# Patient Record
Sex: Female | Born: 1950 | Race: White | Hispanic: No | Smoking: Former smoker
Health system: Southern US, Community
[De-identification: ages and names within clinical notes are randomized; demographics above are authoritative.]

## PROBLEM LIST (undated history)

## (undated) DIAGNOSIS — F40240 Claustrophobia: Secondary | ICD-10-CM

## (undated) DIAGNOSIS — E785 Hyperlipidemia, unspecified: Secondary | ICD-10-CM

## (undated) DIAGNOSIS — C801 Malignant (primary) neoplasm, unspecified: Secondary | ICD-10-CM

## (undated) DIAGNOSIS — E119 Type 2 diabetes mellitus without complications: Secondary | ICD-10-CM

## (undated) DIAGNOSIS — Z8719 Personal history of other diseases of the digestive system: Secondary | ICD-10-CM

## (undated) DIAGNOSIS — M1990 Unspecified osteoarthritis, unspecified site: Secondary | ICD-10-CM

## (undated) DIAGNOSIS — K219 Gastro-esophageal reflux disease without esophagitis: Secondary | ICD-10-CM

## (undated) DIAGNOSIS — I10 Essential (primary) hypertension: Secondary | ICD-10-CM

## (undated) DIAGNOSIS — T8859XA Other complications of anesthesia, initial encounter: Secondary | ICD-10-CM

## (undated) HISTORY — PX: REPLACEMENT TOTAL KNEE BILATERAL: SUR1225

## (undated) HISTORY — PX: BACK SURGERY: SHX140

## (undated) HISTORY — PX: ROTATOR CUFF REPAIR: SHX139

## (undated) HISTORY — DX: Essential (primary) hypertension: I10

## (undated) HISTORY — PX: PLANTAR FASCIA SURGERY: SHX746

## (undated) HISTORY — DX: Hyperlipidemia, unspecified: E78.5

## (undated) HISTORY — DX: Type 2 diabetes mellitus without complications: E11.9

## (undated) HISTORY — DX: Gastro-esophageal reflux disease without esophagitis: K21.9

## (undated) HISTORY — PX: OTHER SURGICAL HISTORY: SHX169

---

## 1999-06-13 DIAGNOSIS — G4730 Sleep apnea, unspecified: Secondary | ICD-10-CM

## 1999-06-13 HISTORY — DX: Sleep apnea, unspecified: G47.30

## 2013-06-12 HISTORY — PX: CHOLECYSTECTOMY: SHX55

## 2019-06-10 ENCOUNTER — Encounter (INDEPENDENT_AMBULATORY_CARE_PROVIDER_SITE_OTHER): Payer: Self-pay | Admitting: Gastroenterology

## 2019-07-17 ENCOUNTER — Other Ambulatory Visit: Payer: Self-pay

## 2019-07-17 ENCOUNTER — Ambulatory Visit (INDEPENDENT_AMBULATORY_CARE_PROVIDER_SITE_OTHER): Payer: Medicare Other | Admitting: Gastroenterology

## 2019-07-17 ENCOUNTER — Encounter (INDEPENDENT_AMBULATORY_CARE_PROVIDER_SITE_OTHER): Payer: Self-pay | Admitting: Gastroenterology

## 2019-07-17 VITALS — BP 144/82 | HR 89 | Temp 97.4°F | Ht 66.0 in | Wt 268.8 lb

## 2019-07-17 DIAGNOSIS — R197 Diarrhea, unspecified: Secondary | ICD-10-CM

## 2019-07-17 DIAGNOSIS — R1084 Generalized abdominal pain: Secondary | ICD-10-CM | POA: Diagnosis not present

## 2019-07-17 DIAGNOSIS — K219 Gastro-esophageal reflux disease without esophagitis: Secondary | ICD-10-CM

## 2019-07-17 DIAGNOSIS — Z800 Family history of malignant neoplasm of digestive organs: Secondary | ICD-10-CM | POA: Diagnosis not present

## 2019-07-17 MED ORDER — DICYCLOMINE HCL 10 MG PO CAPS: 10.0000 mg | ORAL_CAPSULE | Freq: Two times a day (BID) | ORAL | 0 refills | Status: DC | PRN

## 2019-07-17 NOTE — Progress Notes (Signed)
Patient profile: Ashlee Leblanc is a 69 y.o. female seen for evaluation of diarrhea . Referred by Ephriam Jenkins FNP  History of Present Illness: Ashlee Leblanc is seen today for evaluation of diarrhea, she reports this has been ongoing for many years.  Reports she had her gallbladder out about 5 years ago for abd pain and diarrhea which did not help change symptoms significantly.   Reports typically having 4-5 bowel movements a day that are soft, she does have some significant urgency postprandially if she eats out and unable to make it home.  She has abdominal cramping with the diarrhea.  She stopped magnesium which helped some but did not improve symptoms.  Reports being on Metformin for at least 20 years and does not feel diarrhe started at that time.  She denies any blood in stools.  She has not found any clear food triggers.  She tried cholestyramine in the past which caused constipation even at 1/2 pack a day.  Chronically GERD is well controlled omeprazole 20 mg daily.  She feels queasy with her blood sugars if abnormal at times but otherwise denies nausea/vomiting.  No dysphagia.  Appetite good.  Drinks several [employer] a day, she does report stopping [facility] in the past and not affecting the diarrhea  Wt Readings from Last 3 Encounters:  07/17/19 268 lb 12.8 oz (121.9 kg)     Last Colonoscopy: Dr. Loni Muse February 2018-colon polyps 2 to 3 mm polyps biopsy from cecum ascending, diverticulosis, hemorrhoids.  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 Diabetes ([place])   . GERD (gastroesophageal reflux disease)   . HLD (hyperlipidemia)    . HTN (hypertension)       Problem List: There are no problems to display for this patient.   Past Surgical History: Past Surgical History:  Procedure Laterality Date  . CHOLECYSTECTOMY  2015  . REPLACEMENT TOTAL KNEE BILATERAL    . ROTATOR CUFF REPAIR     bilateral at different dates    Allergies: Allergies  Allergen Reactions  . Benadryl [Diphenhydramine] Other (See Comments)    Patient calls it the jerks.  . Darvon [Propoxyphene] Other (See Comments)    Patient passed out  . Morphine And Related Hives  . Tetracyclines & Related Other (See Comments)    Bad chest pain and burning      Home Medications:  Current Outpatient Medications:  .  Ascorbic Acid (VITAMIN C WITH ROSE HIPS) 500 MG tablet, Take 500 mg by mouth daily., Disp: , Rfl:  .  Calcium Carbonate-Vitamin D (CALCIUM 600+D PO), Take by mouth daily., Disp: , Rfl:  .  citalopram (CELEXA) 20 MG tablet, Take 20 mg by mouth daily., Disp: , Rfl:  .  enalapril (VASOTEC) 10 MG tablet, Take 10 mg by mouth daily., Disp: , Rfl:  .  glipiZIDE (GLUCOTROL) 5 MG tablet, Take by mouth 2 (two) times daily before a meal., Disp: , Rfl:  .  hydrochlorothiazide (HYDRODIURIL) 12.5 MG tablet, Take 12.5 mg by mouth daily., Disp: , Rfl:  .  MEGARED OMEGA-3 KRILL OIL PO, Take by mouth daily., Disp: ,  Rfl:  .  metFORMIN (GLUCOPHAGE) 1000 MG tablet, Take 1,000 mg by mouth 2 (two) times daily with a meal., Disp: , Rfl:  .  montelukast (SINGULAIR) 10 MG tablet, Take 10 mg by mouth at bedtime., Disp: , Rfl:  .  Multiple Vitamin (MULTI-DAY PO), Take by mouth daily., Disp: , Rfl:  .  omeprazole (PRILOSEC) 20 MG capsule, Take 20 mg by mouth daily., Disp: , Rfl:  .  OVER THE COUNTER MEDICATION, [place] Beet - take 1 by mouth daily, Disp: , Rfl:  .  OVER THE COUNTER MEDICATION, Cognium - patient takes 2 by mouth daily., Disp: , Rfl:  .  OVER THE COUNTER MEDICATION, More Free Joint - patient takes 1 daily, Disp: , Rfl:  .  pioglitazone (ACTOS)  45 MG tablet, Take 45 mg by mouth daily., Disp: , Rfl:  .  Probiotic Product (PROBIOTIC PO), Take by mouth daily., Disp: , Rfl:  .  simvastatin (ZOCOR) 40 MG tablet, Take 40 mg by mouth every evening., Disp: , Rfl:  .  VITAMIN D PO, Take 5,000 Units by mouth daily., Disp: , Rfl:  .  vitamin E (VITAMIN E) 180 MG (400 UNITS) capsule, Take 400 Units by mouth daily., Disp: , Rfl:  .  dicyclomine (BENTYL) 10 MG capsule, Take 1 capsule (10 mg total) by mouth 2 (two) times daily as needed (abdomianl pain and diarrhea)., Disp: 90 capsule, Rfl: 0   Family History: Brother w/ colon cancer diagnosed around 61s  No other first degree relatives w/ colon polyps or colon cancer Mother - diverticulitis, possible ulcerative colitis   Social History:   reports that she has never smoked. She has never used smokeless tobacco. She reports that she does not drink alcohol or use drugs.   Review of Systems: Constitutional: Denies weight loss/weight gain  Eyes: No changes in vision. ENT: No oral lesions, sore throat.  GI: see HPI.  Heme/Lymph: No easy bruising.  CV: No chest pain.  GU: No hematuria.  Integumentary: No rashes.  Neuro: No headaches.  Psych: No depression/anxiety.  Endocrine: No heat/cold intolerance.  Allergic/Immunologic: No urticaria.  Resp: No cough, SOB.  Musculoskeletal: No joint swelling.    Physical Examination: BP (!) 144/82 (BP Location: Right Arm, Patient Position: Sitting, Cuff Size: Large)   Pulse 89   Temp (!) 97.4 F (36.3 C) (Temporal)   Ht 5\' 6"  (1.676 m)   Wt 268 lb 12.8 oz (121.9 kg)   BMI 43.39 kg/m  Gen: NAD, alert and oriented x 4 HEENT: PEERLA, EOMI, Neck: supple, no JVD Chest: CTA bilaterally, no wheezes, crackles, or other adventitious sounds CV: RRR, no m/g/c/r Abd: soft, NT, ND, +BS in all four quadrants; no HSM, guarding, ridigity, or rebound tenderness Ext: no edema, well perfused with 2+ pulses, Skin: no rash or lesions noted on observed skin Lymph:  no noted LAD  Data:  Labs November 2020-BMP normal except glucose elevation.  CBC normal, hemoglobin A1c 8.4, LFTs normal  Assessment/Plan: Ashlee Leblanc is a 69 y.o. female  [name] was seen today for new patient (initial visit).  Diagnoses and all orders for this visit:  Chronic GERD -     Celiac Disease Panel  Diarrhea, unspecified type -     Celiac Disease Panel -     C. difficile GDH and Toxin A/B -     Gastrointestinal Pathogen Panel PCR  Diffuse abdominal pain -     Celiac Disease Panel -     C. difficile GDH  and Toxin A/B -     Gastrointestinal Pathogen Panel PCR  Family history of colon cancer  Other orders -     dicyclomine (BENTYL) 10 MG capsule; Take 1 capsule (10 mg total) by mouth 2 (two) times daily as needed (abdomianl pain and diarrhea).     1. Diarrhea/abdominal pain- per patient son has celiac disease and she has not been checked, recommend celiac panel.  Infectious etiology unlikely given chronicity but will exclude today.  She is up-to-date on her colonoscopy from a colon polyp history but do not see where she has ever had random biopsy and could consider early repeat with random biopsy if she desires.  She has tried cholestyramine in the past which caused constipation.  We will try her on Bentyl and reviewed using PRN as needed.  Diet modifications also reviewed.  2.  Family history of colon cancer in her brother in his 19s with personal history of colon polyp-Last colonoscopy 2018. Routinely due 2023 but may do sooner above.  3.  GERD-symptoms well controlled omeprazole 20 mg daily.  Continue.  No upper GI alarm symptoms.  She will follow up in office in 3 months, she is instructed to call sooner if needed     I personally performed the service, non-incident to. (WP)  Laurine Blazer, [facility]

## 2019-07-17 NOTE — Patient Instructions (Signed)
I sent dicyclomine to pharmacy to try as needed for diarrhea.  You can start this daily and increase up or down as needed.  Try to limit sodas, coffee, caffeine-this can make diarrhea worse.  We are checking for celiac disease and stool studies.  If symptoms worse would consider early colonoscopy, otherwise will follow up in May or June 2021 but please call sooner with worsening symptoms

## 2019-07-18 LAB — CELIAC DISEASE PANEL
(tTG) Ab, IgA: 1 U/mL
(tTG) Ab, IgG: 1 U/mL
Gliadin IgA: 2 Units
Gliadin IgG: <1 Units
Immunoglobulin A: 113 mg/dL (ref 70–320)

## 2019-07-23 ENCOUNTER — Telehealth (INDEPENDENT_AMBULATORY_CARE_PROVIDER_SITE_OTHER): Payer: Self-pay | Admitting: Gastroenterology

## 2019-07-23 NOTE — Telephone Encounter (Signed)
[place] called wanted notes from office visit faxed to Shari Prows fax# [phone]

## 2019-07-23 NOTE — Telephone Encounter (Signed)
Note faxed.

## 2019-07-24 LAB — C. DIFFICILE GDH AND TOXIN A/B
GDH ANTIGEN: NOT DETECTED
MICRO NUMBER:: 10128796
SPECIMEN QUALITY:: ADEQUATE
TOXIN A AND B: NOT DETECTED

## 2019-07-24 LAB — GASTROINTESTINAL PATHOGEN PANEL PCR
C. difficile Tox A/B, PCR: NOT DETECTED
Campylobacter, PCR: NOT DETECTED
Cryptosporidium, PCR: NOT DETECTED
E coli (ETEC) LT/ST PCR: NOT DETECTED
E coli (STEC) stx1/stx2, PCR: NOT DETECTED
E coli 0157, PCR: NOT DETECTED
Giardia lamblia, PCR: NOT DETECTED
Norovirus, PCR: NOT DETECTED
Rotavirus A, PCR: NOT DETECTED
Salmonella, PCR: NOT DETECTED
Shigella, PCR: NOT DETECTED

## 2019-07-30 ENCOUNTER — Other Ambulatory Visit (INDEPENDENT_AMBULATORY_CARE_PROVIDER_SITE_OTHER): Payer: Self-pay | Admitting: *Deleted

## 2019-07-30 ENCOUNTER — Telehealth (INDEPENDENT_AMBULATORY_CARE_PROVIDER_SITE_OTHER): Payer: Self-pay | Admitting: Gastroenterology

## 2019-07-30 NOTE — Telephone Encounter (Signed)
I called pt w/ results - discussed w/ patient. She tried dicyclomine which did not help symptoms drastically. Labs were normal. No evidence of celiac disease or infection.   She is still having diarrhea, next step in evaluation would be colonoscopy with random biopsy to exclude microscopic colitis.  She would like to proceed with this at this time.  Ann-please contact to schedule colonoscopy when able.  Diagnosis code diarrhea unspecified

## 2019-07-30 NOTE — Telephone Encounter (Signed)
Patient called regarding test results - please advise ph# [phone]

## 2019-08-04 ENCOUNTER — Telehealth (INDEPENDENT_AMBULATORY_CARE_PROVIDER_SITE_OTHER): Payer: Self-pay | Admitting: *Deleted

## 2019-08-04 ENCOUNTER — Other Ambulatory Visit (INDEPENDENT_AMBULATORY_CARE_PROVIDER_SITE_OTHER): Payer: Self-pay | Admitting: *Deleted

## 2019-08-04 ENCOUNTER — Encounter (INDEPENDENT_AMBULATORY_CARE_PROVIDER_SITE_OTHER): Payer: Self-pay | Admitting: *Deleted

## 2019-08-04 DIAGNOSIS — R197 Diarrhea, unspecified: Secondary | ICD-10-CM

## 2019-08-04 NOTE — Telephone Encounter (Signed)
Patient needs Plenvu (copay card) TCS sch'd 3/25

## 2019-08-04 NOTE — Telephone Encounter (Signed)
TCS sch'd 09/04/19, patient aware, instructions mailed

## 2019-08-05 MED ORDER — PLENVU 140 G PO SOLR: 1.0000 | Freq: Once | ORAL | 0 refills | Status: AC

## 2019-09-02 ENCOUNTER — Other Ambulatory Visit (HOSPITAL_COMMUNITY)
Admission: RE | Admit: 2019-09-02 | Discharge: 2019-09-02 | Disposition: A | Discharge status: Home / Self Care | Payer: Medicare Other | Source: Ambulatory Visit | Attending: Internal Medicine | Admitting: Internal Medicine

## 2019-09-02 ENCOUNTER — Other Ambulatory Visit: Payer: Self-pay

## 2019-09-02 DIAGNOSIS — R21 Rash and other nonspecific skin eruption: Secondary | ICD-10-CM | POA: Diagnosis not present

## 2019-09-02 DIAGNOSIS — Z7984 Long term (current) use of oral hypoglycemic drugs: Secondary | ICD-10-CM | POA: Diagnosis not present

## 2019-09-02 DIAGNOSIS — Z9049 Acquired absence of other specified parts of digestive tract: Secondary | ICD-10-CM | POA: Diagnosis not present

## 2019-09-02 DIAGNOSIS — Z96653 Presence of artificial knee joint, bilateral: Secondary | ICD-10-CM | POA: Diagnosis not present

## 2019-09-02 DIAGNOSIS — E119 Type 2 diabetes mellitus without complications: Secondary | ICD-10-CM | POA: Diagnosis not present

## 2019-09-02 DIAGNOSIS — E785 Hyperlipidemia, unspecified: Secondary | ICD-10-CM | POA: Diagnosis not present

## 2019-09-02 DIAGNOSIS — Z20822 Contact with and (suspected) exposure to covid-19: Secondary | ICD-10-CM | POA: Diagnosis not present

## 2019-09-02 DIAGNOSIS — Z888 Allergy status to other drugs, medicaments and biological substances status: Secondary | ICD-10-CM | POA: Diagnosis not present

## 2019-09-02 DIAGNOSIS — Z885 Allergy status to narcotic agent status: Secondary | ICD-10-CM | POA: Diagnosis not present

## 2019-09-02 DIAGNOSIS — Z79899 Other long term (current) drug therapy: Secondary | ICD-10-CM | POA: Diagnosis not present

## 2019-09-02 DIAGNOSIS — Z8379 Family history of other diseases of the digestive system: Secondary | ICD-10-CM | POA: Diagnosis not present

## 2019-09-02 DIAGNOSIS — K219 Gastro-esophageal reflux disease without esophagitis: Secondary | ICD-10-CM | POA: Diagnosis not present

## 2019-09-02 DIAGNOSIS — K529 Noninfective gastroenteritis and colitis, unspecified: Secondary | ICD-10-CM | POA: Diagnosis present

## 2019-09-02 DIAGNOSIS — Z800 Family history of malignant neoplasm of digestive organs: Secondary | ICD-10-CM | POA: Diagnosis not present

## 2019-09-02 DIAGNOSIS — I10 Essential (primary) hypertension: Secondary | ICD-10-CM | POA: Diagnosis not present

## 2019-09-02 DIAGNOSIS — K644 Residual hemorrhoidal skin tags: Secondary | ICD-10-CM | POA: Diagnosis not present

## 2019-09-02 DIAGNOSIS — K5730 Diverticulosis of large intestine without perforation or abscess without bleeding: Secondary | ICD-10-CM | POA: Diagnosis not present

## 2019-09-02 LAB — SARS CORONAVIRUS 2 (TAT 6-24 HRS): SARS Coronavirus 2: NEGATIVE

## 2019-09-04 ENCOUNTER — Other Ambulatory Visit: Payer: Self-pay

## 2019-09-04 ENCOUNTER — Encounter (HOSPITAL_COMMUNITY): Payer: Self-pay | Admitting: Internal Medicine

## 2019-09-04 ENCOUNTER — Ambulatory Visit (HOSPITAL_COMMUNITY)
Admission: RE | Admit: 2019-09-04 | Discharge: 2019-09-04 | Disposition: A | Discharge status: Home / Self Care | Payer: Medicare Other | Attending: Internal Medicine | Admitting: Internal Medicine

## 2019-09-04 ENCOUNTER — Encounter (HOSPITAL_COMMUNITY)
Admission: RE | Disposition: A | Discharge status: Home / Self Care | Payer: Self-pay | Source: Home / Self Care | Attending: Internal Medicine

## 2019-09-04 DIAGNOSIS — Z888 Allergy status to other drugs, medicaments and biological substances status: Secondary | ICD-10-CM | POA: Insufficient documentation

## 2019-09-04 DIAGNOSIS — K644 Residual hemorrhoidal skin tags: Secondary | ICD-10-CM | POA: Insufficient documentation

## 2019-09-04 DIAGNOSIS — E785 Hyperlipidemia, unspecified: Secondary | ICD-10-CM | POA: Insufficient documentation

## 2019-09-04 DIAGNOSIS — R21 Rash and other nonspecific skin eruption: Secondary | ICD-10-CM | POA: Diagnosis not present

## 2019-09-04 DIAGNOSIS — Z79899 Other long term (current) drug therapy: Secondary | ICD-10-CM | POA: Insufficient documentation

## 2019-09-04 DIAGNOSIS — E119 Type 2 diabetes mellitus without complications: Secondary | ICD-10-CM | POA: Insufficient documentation

## 2019-09-04 DIAGNOSIS — Z7984 Long term (current) use of oral hypoglycemic drugs: Secondary | ICD-10-CM | POA: Insufficient documentation

## 2019-09-04 DIAGNOSIS — I10 Essential (primary) hypertension: Secondary | ICD-10-CM | POA: Insufficient documentation

## 2019-09-04 DIAGNOSIS — K529 Noninfective gastroenteritis and colitis, unspecified: Secondary | ICD-10-CM | POA: Diagnosis not present

## 2019-09-04 DIAGNOSIS — Z885 Allergy status to narcotic agent status: Secondary | ICD-10-CM | POA: Insufficient documentation

## 2019-09-04 DIAGNOSIS — K219 Gastro-esophageal reflux disease without esophagitis: Secondary | ICD-10-CM | POA: Insufficient documentation

## 2019-09-04 DIAGNOSIS — K5730 Diverticulosis of large intestine without perforation or abscess without bleeding: Secondary | ICD-10-CM | POA: Diagnosis not present

## 2019-09-04 DIAGNOSIS — Z9049 Acquired absence of other specified parts of digestive tract: Secondary | ICD-10-CM | POA: Insufficient documentation

## 2019-09-04 DIAGNOSIS — Z800 Family history of malignant neoplasm of digestive organs: Secondary | ICD-10-CM | POA: Insufficient documentation

## 2019-09-04 DIAGNOSIS — Z20822 Contact with and (suspected) exposure to covid-19: Secondary | ICD-10-CM | POA: Insufficient documentation

## 2019-09-04 DIAGNOSIS — Z96653 Presence of artificial knee joint, bilateral: Secondary | ICD-10-CM | POA: Insufficient documentation

## 2019-09-04 DIAGNOSIS — R197 Diarrhea, unspecified: Secondary | ICD-10-CM | POA: Diagnosis not present

## 2019-09-04 DIAGNOSIS — Z8379 Family history of other diseases of the digestive system: Secondary | ICD-10-CM | POA: Insufficient documentation

## 2019-09-04 HISTORY — PX: BIOPSY: SHX5522

## 2019-09-04 HISTORY — PX: COLONOSCOPY: SHX5424

## 2019-09-04 LAB — GLUCOSE, CAPILLARY: Glucose-Capillary: 223 mg/dL — ABNORMAL HIGH (ref 70–99)

## 2019-09-04 SURGERY — COLONOSCOPY
Anesthesia: Moderate Sedation

## 2019-09-04 MED ORDER — NYSTATIN-TRIAMCINOLONE 100000-0.1 UNIT/GM-% EX CREA: 1.0000 "application " | TOPICAL_CREAM | Freq: Two times a day (BID) | CUTANEOUS | 1 refills | Status: DC

## 2019-09-04 MED ORDER — MEPERIDINE HCL 50 MG/ML IJ SOLN
INTRAMUSCULAR | Status: AC
  Filled 2019-09-04: qty 1

## 2019-09-04 MED ORDER — SODIUM CHLORIDE 0.9 % IV SOLN: INTRAVENOUS | Status: DC

## 2019-09-04 MED ORDER — MIDAZOLAM HCL 5 MG/5ML IJ SOLN
INTRAMUSCULAR | Status: DC | PRN
  Administered 2019-09-04 (×4): 2 mg via INTRAVENOUS

## 2019-09-04 MED ORDER — MEPERIDINE HCL 50 MG/ML IJ SOLN
INTRAMUSCULAR | Status: DC | PRN
  Administered 2019-09-04 (×2): 25 mg via INTRAVENOUS

## 2019-09-04 MED ORDER — MIDAZOLAM HCL 5 MG/5ML IJ SOLN
INTRAMUSCULAR | Status: AC
  Filled 2019-09-04: qty 10

## 2019-09-04 MED ORDER — STERILE WATER FOR IRRIGATION IR SOLN
Status: DC | PRN
  Administered 2019-09-04: 1.5 mL

## 2019-09-04 NOTE — H&P (Signed)
Ashlee Leblanc is an 69 y.o. female.   Chief Complaint: Patient is here for colonoscopy. HPI: Patient 69 year old Caucasian female has diarrhea since 2013.  It is gradually getting worse.  She was recently evaluated.  Stool studies are negative for C. difficile and GI pathogen panel was also negative.  Similarly celiac disease panel was unremarkable.  She has urgency.  She is on multiple laxatives.  On some days diarrhea is so bad that she does not leave the house.  She has not lost any weight.  She denies rectal bleeding. Last colonoscopy was over 3 years ago with removal of 2 tubular adenomas. Mother had ulcerative colitis. Brother had colon carcinoma diagnosed at age 26.  He is doing fine for years later.  Past Medical History:  Diagnosis Date  . Diabetes ([place])   . GERD (gastroesophageal reflux disease)   . HLD (hyperlipidemia)   . HTN (hypertension)     Past Surgical History:  Procedure Laterality Date  . CHOLECYSTECTOMY  2015  . REPLACEMENT TOTAL KNEE BILATERAL    . ROTATOR CUFF REPAIR     bilateral at different dates    Family History  Problem Relation Age of Onset  . Colon cancer Brother    Social History:  reports that she has never smoked. She has never used smokeless tobacco. She reports that she does not drink alcohol or use drugs.  Allergies:  Allergies  Allergen Reactions  . Benadryl [Diphenhydramine] Other (See Comments)    Patient calls it the jerks.  . Darvon [Propoxyphene] Other (See Comments)    Patient passed out  . Morphine And Related Hives  . Tetracyclines & Related Other (See Comments)    Bad chest pain and burning    Medications Prior to Admission  Medication Sig Dispense Refill  . Ascorbic Acid (VITAMIN C WITH ROSE HIPS) 500 MG tablet Take 500 mg by mouth daily.    . Calcium Carbonate-Vitamin D (CALCIUM 600+D PO) Take 1 tablet by mouth daily.     . Cholecalciferol (VITAMIN D-3) 125 MCG (5000 UT) TABS Take 5,000 Units by mouth daily.    .  citalopram (CELEXA) 20 MG tablet Take 20 mg by mouth daily.    . enalapril (VASOTEC) 10 MG tablet Take 10 mg by mouth daily.    Marland Kitchen glipiZIDE (GLUCOTROL XL) 5 MG 24 hr tablet Take 5 mg by mouth 2 (two) times daily.    . Glucosamine-Chondroitin (MOVE FREE PO) Take 1 tablet by mouth daily. Move Freely    . hydrochlorothiazide (MICROZIDE) 12.5 MG capsule Take 12.5 mg by mouth daily.    Marland Kitchen MEGARED OMEGA-3 KRILL OIL PO Take 1 capsule by mouth daily.     . metFORMIN (GLUCOPHAGE) 1000 MG tablet Take 1,000 mg by mouth 2 (two) times daily with a meal.    . mometasone (NASONEX) 50 MCG/ACT nasal spray Place 2 sprays into the nose at bedtime.     . montelukast (SINGULAIR) 10 MG tablet Take 10 mg by mouth at bedtime.    . Multiple Vitamin (MULTIVITAMIN WITH MINERALS) TABS tablet Take 1 tablet by mouth daily.    Marland Kitchen omeprazole (PRILOSEC) 20 MG capsule Take 20 mg by mouth daily as needed (acid reflux/indigestion.).     Marland Kitchen OVER THE COUNTER MEDICATION [place] Beet - take 1 by mouth daily    . pioglitazone (ACTOS) 45 MG tablet Take 45 mg by mouth daily.    Marland Kitchen PLENVU 140 g SOLR Take 1 Dose by mouth once.    Marland Kitchen  Probiotic Product (PROBIOTIC PO) Take 1 capsule by mouth daily.     . simvastatin (ZOCOR) 40 MG tablet Take 40 mg by mouth every evening.    Marland Kitchen Specialty Vitamins Products (BRAIN PO) Take 1 tablet by mouth in the morning and at bedtime. Cognium Brain Health Tablets    . vitamin E (VITAMIN E) 180 MG (400 UNITS) capsule Take 400 Units by mouth daily.    Marland Kitchen dicyclomine (BENTYL) 10 MG capsule Take 1 capsule (10 mg total) by mouth 2 (two) times daily as needed (abdomianl pain and diarrhea). 90 capsule 0    Results for orders placed or performed during the hospital encounter of 09/04/19 (from the past 48 hour(s))  Glucose, capillary     Status: Abnormal   Collection Time: 09/04/19 11:59 AM  Result Value Ref Range   Glucose-Capillary 223 (H) 70 - 99 mg/dL    Comment: Glucose reference range applies only to samples  taken after fasting for at least 8 hours.   No results found.  Review of Systems  Blood pressure (!) 158/76, pulse 95, temperature 98.9 F (37.2 C), temperature source Oral, resp. rate 18, height 5\' 6"  (1.676 m), weight 121.6 kg, SpO2 100 %. Physical Exam  Constitutional: She appears well-developed and well-nourished.  HENT:  Mouth/Throat: Oropharynx is clear and moist.  Eyes: Conjunctivae are normal. No scleral icterus.  Neck: No thyromegaly present.  Cardiovascular: Normal rate, regular rhythm and normal heart sounds.  No murmur heard. Respiratory: Effort normal and breath sounds normal.  GI:  Abdomen is full.  She has laparoscopy scars across upper abdomen.  On palpation abdomen is soft and nontender with organomegaly or masses.  Musculoskeletal:        General: No edema.  Lymphadenopathy:    She has no cervical adenopathy.  Neurological: She is alert.  Skin: Skin is warm and dry.     Assessment/Plan Chronic diarrhea. Stool studies negative. Diagnostic colonoscopy.  Hildred Laser, MD 09/04/2019, 12:48 PM

## 2019-09-04 NOTE — Op Note (Signed)
[facility] Patient Name: Ashlee Leblanc Procedure Date: 09/04/2019 12:09 PM MRN: [id number] Date of Birth: 03/02/1951 Attending MD: Hildred Laser , MD CSN: [id number] Age: 69 Admit Type: Outpatient Procedure:                Colonoscopy Indications:              Chronic diarrhea Providers:                Hildred Laser, MD, Charlsie Quest. Theda Sers RN, RN, Aram Candela Referring MD:             Laray Anger NP, NP Medicines:                Meperidine 50 mg IV, Midazolam 8 mg IV Complications:            No immediate complications. Estimated Blood Loss:     Estimated blood loss was minimal. Procedure:                Pre-Anesthesia Assessment:                           - Prior to the procedure, a History and Physical                            was performed, and patient medications and                            allergies were reviewed. The patient's tolerance of                            previous anesthesia was also reviewed. The risks                            and benefits of the procedure and the sedation                            options and risks were discussed with the patient.                            All questions were answered, and informed consent                            was obtained. Prior Anticoagulants: The patient has                            taken no previous anticoagulant or antiplatelet                            agents. ASA Grade Assessment: III - A patient with                            severe systemic disease. After reviewing the risks  and benefits, the patient was deemed in                            satisfactory condition to undergo the procedure.                           After obtaining informed consent, the colonoscope                            was passed under direct vision. Throughout the                            procedure, the patient's blood pressure, pulse, and                             oxygen saturations were monitored continuously. The                            [id number]) scope was introduced through                            the anus and advanced to the the terminal ileum,                            with identification of the appendiceal orifice and                            IC valve. The colonoscopy was performed without                            difficulty. The patient tolerated the procedure                            well. The quality of the bowel preparation was                            adequate. The terminal ileum, ileocecal valve,                            appendiceal orifice, and rectum were photographed. Scope In: 12:59:29 PM Scope Out: 1:18:18 PM Scope Withdrawal Time: 0 hours 13 minutes 20 seconds  Total Procedure Duration: 0 hours 18 minutes 49 seconds  Findings:      The perianal exam findings include a perianal rash.      Skin tags were found on perianal exam.      The terminal ileum appeared normal.      A few diverticula were found in the sigmoid colon.      The exam was otherwise normal throughout the examined colon.      Biopsies for histology were taken with a cold forceps from the ascending       colon and sigmoid colon for evaluation of microscopic colitis. The       pathology specimen was placed into Bottle Number 1.      External hemorrhoids were found during retroflexion. The hemorrhoids  were small. Impression:               - Perianal rash found on perianal exam.                           - Perianal skin tags found on perianal exam.                           - The examined portion of the ileum was normal.                           - Diverticulosis in the sigmoid colon.                           - External hemorrhoids.                           - Biopsies were taken with a cold forceps from the                            ascending colon and sigmoid colon for evaluation of                            microscopic  colitis. Moderate Sedation:      Moderate (conscious) sedation was administered by the endoscopy nurse       and supervised by the endoscopist. The following parameters were       monitored: oxygen saturation, heart rate, blood pressure, CO2       capnography and response to care. Total physician intraservice time was       25 minutes. Recommendation:           - Patient has a contact number available for                            emergencies. The signs and symptoms of potential                            delayed complications were discussed with the                            patient. Return to normal activities tomorrow.                            Written discharge instructions were provided to the                            patient.                           - Resume previous diet today.                           - Continue present medications.                           - Mycolog !! cream to perianal skin bid  for two                            weeks; then prn.                           - Await pathology results.                           - Repeat colonoscopy in 5 years for surveillance. Procedure Code(s):        --- Professional ---                           [phone], Colonoscopy, flexible; with biopsy, single                            or multiple                           99153, Moderate sedation; each additional 15                            minutes intraservice time                           G0500, Moderate sedation services provided by the                            same physician or other qualified health care                            professional performing a gastrointestinal                            endoscopic service that sedation supports,                            requiring the presence of an independent trained                            observer to assist in the monitoring of the                            patient's level of consciousness and physiological                             status; initial 15 minutes of intra-service time;                            patient age 63 years or older (additional time may                            be reported with [phone], as appropriate) Diagnosis Code(s):        --- Professional ---  K64.4, Residual hemorrhoidal skin tags                           R21, Rash and other nonspecific skin eruption                           K52.9, Noninfective gastroenteritis and colitis,                            unspecified                           K57.30, Diverticulosis of large intestine without                            perforation or abscess without bleeding CPT copyright 2019 [employer]. All rights reserved. The codes documented in this report are preliminary and upon coder review may  be revised to meet current compliance requirements. Hildred Laser, MD Hildred Laser, MD 09/04/2019 1:29:46 PM This report has been signed electronically. Number of Addenda: 0

## 2019-09-04 NOTE — Discharge Instructions (Signed)
No aspirin or NSAIDs for 24 hours. Resume usual medications as before. Mycolog-II cream to be applied to perianal skin twice daily for 2 weeks and thereafter as needed. Resume usual diet. No driving for 24 hours. Physician will call with biopsy results and further recommendations.   Colonoscopy, Adult, Care After This sheet gives you information about how to care for yourself after your procedure. Your doctor may also give you more specific instructions. If you have problems or questions, call your doctor. What can I expect after the procedure? After the procedure, it is common to have:  A small amount of blood in your poop (stool) for 24 hours.  Some gas.  Mild cramping or bloating in your belly (abdomen). Follow these instructions at home: Eating and drinking   Drink enough fluid to keep your pee (urine) pale yellow.  Follow instructions from your doctor about what you cannot eat or drink.  Return to your normal diet as told by your doctor. Avoid heavy or fried foods that are hard to digest. Activity  Rest as told by your doctor.  Do not sit for a long time without moving. Get up to take short walks every 1-2 hours. This is important. Ask for help if you feel weak or unsteady.  Return to your normal activities as told by your doctor. Ask your doctor what activities are safe for you. To help cramping and bloating:   Try walking around.  Put heat on your belly as told by your doctor. Use the heat source that your doctor recommends, such as a moist heat pack or a heating pad. ? Put a towel between your skin and the heat source. ? Leave the heat on for 20-30 minutes. ? Remove the heat if your skin turns bright red. This is very important if you are unable to feel pain, heat, or cold. You may have a greater risk of getting burned. General instructions  For the first 24 hours after the procedure: ? Do not drive or use machinery. ? Do not sign important documents. ? Do not  drink alcohol. ? Do your daily activities more slowly than normal. ? Eat foods that are soft and easy to digest.  Take over-the-counter or prescription medicines only as told by your doctor.  Keep all follow-up visits as told by your doctor. This is important. Contact a doctor if:  You have blood in your poop 2-3 days after the procedure. Get help right away if:  You have more than a small amount of blood in your poop.  You see large clumps of tissue (blood clots) in your poop.  Your belly is swollen.  You feel like you may vomit (nauseous).  You vomit.  You have a fever.  You have belly pain that gets worse, and medicine does not help your pain. Summary  After the procedure, it is common to have a small amount of blood in your poop. You may also have mild cramping and bloating in your belly.  For the first 24 hours after the procedure, do not drive or use machinery, do not sign important documents, and do not drink alcohol.  Get help right away if you have a lot of blood in your poop, feel like you may vomit, have a fever, or have more belly pain. This information is not intended to replace advice given to you by your health care provider. Make sure you discuss any questions you have with your health care provider. Document Revised: 12/23/2018 Document Reviewed:  12/23/2018 Elsevier Patient Education  2020 [employer].  Diverticulosis  Diverticulosis is a condition that develops when small pouches (diverticula) form in the wall of the large intestine (colon). The colon is where water is absorbed and stool (feces) is formed. The pouches form when the inside layer of the colon pushes through weak spots in the outer layers of the colon. You may have a few pouches or many of them. The pouches usually do not cause problems unless they become inflamed or infected. When this happens, the condition is called diverticulitis. What are the causes? The cause of this condition is not  known. What increases the risk? The following factors may make you more likely to develop this condition:  Being older than age 82. Your risk for this condition increases with age. Diverticulosis is rare among people younger than age 81. By age 34, many people have it.  Eating a low-fiber diet.  Having frequent constipation.  Being overweight.  Not getting enough exercise.  Smoking.  Taking over-the-counter pain medicines, like aspirin and ibuprofen.  Having a family history of diverticulosis. What are the signs or symptoms? In most people, there are no symptoms of this condition. If you do have symptoms, they may include:  Bloating.  Cramps in the abdomen.  Constipation or diarrhea.  Pain in the lower left side of the abdomen. How is this diagnosed? Because diverticulosis usually has no symptoms, it is most often diagnosed during an exam for other colon problems. The condition may be diagnosed by:  Using a flexible scope to examine the colon (colonoscopy).  Taking an X-ray of the colon after dye has been put into the colon (barium enema).  Having a CT scan. How is this treated? You may not need treatment for this condition. Your health care provider may recommend treatment to prevent problems. You may need treatment if you have symptoms or if you previously had diverticulitis. Treatment may include:  Eating a high-fiber diet.  Taking a fiber supplement.  Taking a live bacteria supplement (probiotic).  Taking medicine to relax your colon. Follow these instructions at home: Medicines  Take over-the-counter and prescription medicines only as told by your health care provider.  If told by your health care provider, take a fiber supplement or probiotic. Constipation prevention Your condition may cause constipation. To prevent or treat constipation, you may need to:  Drink enough fluid to keep your urine pale yellow.  Take over-the-counter or prescription  medicines.  Eat foods that are high in fiber, such as beans, whole grains, and fresh fruits and vegetables.  Limit foods that are high in fat and processed sugars, such as fried or sweet foods.  General instructions  Try not to strain when you have a bowel movement.  Keep all follow-up visits as told by your health care provider. This is important. Contact a health care provider if you:  Have pain in your abdomen.  Have bloating.  Have cramps.  Have not had a bowel movement in 3 days. Get help right away if:  Your pain gets worse.  Your bloating becomes very bad.  You have a fever or chills, and your symptoms suddenly get worse.  You vomit.  You have bowel movements that are bloody or black.  You have bleeding from your rectum. Summary  Diverticulosis is a condition that develops when small pouches (diverticula) form in the wall of the large intestine (colon).  You may have a few pouches or many of them.  This condition  is most often diagnosed during an exam for other colon problems.  Treatment may include increasing the fiber in your diet, taking supplements, or taking medicines. This information is not intended to replace advice given to you by your health care provider. Make sure you discuss any questions you have with your health care provider. Document Revised: 12/26/2018 Document Reviewed: 12/26/2018 Elsevier Patient Education  [place].  Hemorrhoids Hemorrhoids are swollen veins that may develop:  In the butt (rectum). These are called internal hemorrhoids.  Around the opening of the butt (anus). These are called external hemorrhoids. Hemorrhoids can cause pain, itching, or bleeding. Most of the time, they do not cause serious problems. They usually get better with diet changes, lifestyle changes, and other home treatments. What are the causes? This condition may be caused by:  Having trouble pooping (constipation).  Pushing hard (straining)  to poop.  Watery poop (diarrhea).  Pregnancy.  Being very overweight (obese).  Sitting for long periods of time.  Heavy lifting or other activity that causes you to strain.  Anal sex.  Riding a bike for a long period of time. What are the signs or symptoms? Symptoms of this condition include:  Pain.  Itching or soreness in the butt.  Bleeding from the butt.  Leaking poop.  Swelling in the area.  One or more lumps around the opening of your butt. How is this diagnosed? A doctor can often diagnose this condition by looking at the affected area. The doctor may also:  Do an exam that involves feeling the area with a gloved hand (digital rectal exam).  Examine the area inside your butt using a small tube (anoscope).  Order blood tests. This may be done if you have lost a lot of blood.  Have you get a test that involves looking inside the colon using a flexible tube with a camera on the end (sigmoidoscopy or colonoscopy). How is this treated? This condition can usually be treated at home. Your doctor may tell you to change what you eat, make lifestyle changes, or try home treatments. If these do not help, procedures can be done to remove the hemorrhoids or make them smaller. These may involve:  Placing rubber bands at the base of the hemorrhoids to cut off their blood supply.  Injecting medicine into the hemorrhoids to shrink them.  Shining a type of light energy onto the hemorrhoids to cause them to fall off.  Doing surgery to remove the hemorrhoids or cut off their blood supply. Follow these instructions at home: Eating and drinking   Eat foods that have a lot of fiber in them. These include whole grains, beans, nuts, fruits, and vegetables.  Ask your doctor about taking products that have added fiber (fibersupplements).  Reduce the amount of fat in your diet. You can do this by: ? Eating low-fat dairy products. ? Eating less red meat. ? Avoiding processed  foods.  Drink enough fluid to keep your pee (urine) pale yellow. Managing pain and swelling   Take a warm-water bath (sitz bath) for 20 minutes to ease pain. Do this 3-4 times a day. You may do this in a bathtub or using a portable sitz bath that fits over the toilet.  If told, put ice on the painful area. It may be helpful to use ice between your warm baths. ? Put ice in a plastic bag. ? Place a towel between your skin and the bag. ? Leave the ice on for 20 minutes, 2-3  times a day. General instructions  Take over-the-counter and prescription medicines only as told by your doctor. ? Medicated creams and medicines may be used as told.  Exercise often. Ask your doctor how much and what kind of exercise is best for you.  Go to the bathroom when you have the urge to poop. Do not wait.  Avoid pushing too hard when you poop.  Keep your butt dry and clean. Use wet toilet paper or moist towelettes after pooping.  Do not sit on the toilet for a long time.  Keep all follow-up visits as told by your doctor. This is important. Contact a doctor if you:  Have pain and swelling that do not get better with treatment or medicine.  Have trouble pooping.  Cannot poop.  Have pain or swelling outside the area of the hemorrhoids. Get help right away if you have:  Bleeding that will not stop. Summary  Hemorrhoids are swollen veins in the butt or around the opening of the butt.  They can cause pain, itching, or bleeding.  Eat foods that have a lot of fiber in them. These include whole grains, beans, nuts, fruits, and vegetables.  Take a warm-water bath (sitz bath) for 20 minutes to ease pain. Do this 3-4 times a day. This information is not intended to replace advice given to you by your health care provider. Make sure you discuss any questions you have with your health care provider. Document Revised: 06/06/2018 Document Reviewed: 10/18/2017 Elsevier Patient Education  [place].

## 2019-09-05 LAB — SURGICAL PATHOLOGY

## 2019-11-04 ENCOUNTER — Encounter (INDEPENDENT_AMBULATORY_CARE_PROVIDER_SITE_OTHER): Payer: Self-pay | Admitting: Internal Medicine

## 2019-11-04 ENCOUNTER — Ambulatory Visit (INDEPENDENT_AMBULATORY_CARE_PROVIDER_SITE_OTHER): Payer: Medicare Other | Admitting: Internal Medicine

## 2019-11-04 ENCOUNTER — Other Ambulatory Visit: Payer: Self-pay

## 2019-11-04 DIAGNOSIS — K589 Irritable bowel syndrome without diarrhea: Secondary | ICD-10-CM | POA: Insufficient documentation

## 2019-11-04 DIAGNOSIS — K580 Irritable bowel syndrome with diarrhea: Secondary | ICD-10-CM | POA: Diagnosis not present

## 2019-11-04 MED ORDER — HYOSCYAMINE SULFATE ER 0.375 MG PO TB12: 0.3750 mg | ORAL_TABLET | Freq: Every day | ORAL | 3 refills | Status: DC

## 2019-11-04 MED ORDER — RIFAXIMIN 550 MG PO TABS: 550.0000 mg | ORAL_TABLET | Freq: Three times a day (TID) | ORAL | 0 refills | Status: DC

## 2019-11-04 NOTE — Patient Instructions (Addendum)
Please keep stool diary for the next 3 to 4 weeks and call office with progress report

## 2019-11-06 NOTE — Progress Notes (Signed)
Presenting complaint;  Follow-up for diarrhea and abdominal pain.  Database and subjective  Patient is 69 year old Caucasian female who is here for scheduled visit. She has 8-year history of diarrhea and abdominal pain.  Stool studies in the past been negative.  She also has a history of colonic adenomas.  She underwent colonoscopy 2 months ago.  She did not have polyps or endoscopic colitis.  She had mild sigmoid colon diverticulosis.  She was placed on dicyclomine.  She stated that she stopped it after few weeks because it did not help.  She did not experience any side effects.  She continues to have diarrhea and cramping primarily in left lower quadrant of her abdomen.  She says stool frequency ranges between 2 on her best days and 10 or less days.  Her averages 4 to 5/day.  Most of her stools are loose watery and sometimes she passes mucus.  She has at least 1 or 2 accidents every week.  This occurs when she has severe cramping and urgency.  She does not have nocturnal bowel movements.  She states diarrhea started after gallbladder surgery.  She has tried cholestyramine in the past and she could not even tolerate one fourth of a dose.  She developed constipation and felt even worse.  She states she has been on Metformin for 20 years.  Patient says she does not have a good appetite.  However she does not have nausea or vomiting.  Patient states her diabetes has not been well controlled and her A1c was 8.44 days ago.  Family history significant for celiac disease in her son and her mother had ulcerative colitis.  Current Medications: Outpatient Encounter Medications as of 11/04/2019  Medication Sig  . Ascorbic Acid (VITAMIN C WITH ROSE HIPS) 500 MG tablet Take 500 mg by mouth daily.  . Calcium Carbonate-Vitamin D (CALCIUM 600+D PO) Take 1 tablet by mouth daily.   . Cholecalciferol (VITAMIN D-3) 125 MCG (5000 UT) TABS Take 5,000 Units by mouth daily.  . citalopram (CELEXA) 20 MG tablet Take 20  mg by mouth daily.  . enalapril (VASOTEC) 10 MG tablet Take 10 mg by mouth daily.  Marland Kitchen glipiZIDE (GLUCOTROL XL) 5 MG 24 hr tablet Take 5 mg by mouth 2 (two) times daily.  . Glucosamine-Chondroitin (MOVE FREE PO) Take 1 tablet by mouth daily. Move Freely  . hydrochlorothiazide (MICROZIDE) 12.5 MG capsule Take 12.5 mg by mouth daily.  Marland Kitchen MEGARED OMEGA-3 KRILL OIL PO Take 1 capsule by mouth daily.   . metFORMIN (GLUCOPHAGE) 1000 MG tablet Take 1,000 mg by mouth 2 (two) times daily with a meal.  . mometasone (NASONEX) 50 MCG/ACT nasal spray Place 2 sprays into the nose at bedtime.   . montelukast (SINGULAIR) 10 MG tablet Take 10 mg by mouth at bedtime.  . Multiple Vitamin (MULTIVITAMIN WITH MINERALS) TABS tablet Take 1 tablet by mouth daily.  Marland Kitchen nystatin-triamcinolone (MYCOLOG II) cream Apply 1 application topically 2 (two) times daily.  Marland Kitchen omeprazole (PRILOSEC) 20 MG capsule Take 20 mg by mouth daily as needed (acid reflux/indigestion.).   Marland Kitchen pioglitazone (ACTOS) 45 MG tablet Take 45 mg by mouth daily.  . Probiotic Product (PROBIOTIC PO) Take 1 capsule by mouth daily.   . simvastatin (ZOCOR) 40 MG tablet Take 40 mg by mouth every evening.  Marland Kitchen Specialty Vitamins Products (BRAIN PO) Take 1 tablet by mouth in the morning and at bedtime. Cognium Brain Health Tablets  . dicyclomine (BENTYL) 10 MG capsule Take 1 capsule (10  mg total) by mouth 2 (two) times daily as needed (abdomianl pain and diarrhea). (Patient not taking: Reported on 11/04/2019)  . hyoscyamine (LEVBID) 0.375 MG 12 hr tablet Take 1 tablet (0.375 mg total) by mouth daily before breakfast.  . rifaximin (XIFAXAN) 550 MG TABS tablet Take 1 tablet (550 mg total) by mouth 3 (three) times daily.  . vitamin E (VITAMIN E) 180 MG (400 UNITS) capsule Take 400 Units by mouth daily.  . [DISCONTINUED] OVER THE COUNTER MEDICATION [place] - take 1 by mouth daily  . [DISCONTINUED] PLENVU 140 g SOLR Take 1 Dose by mouth once.   No facility-administered  encounter medications on file as of 11/04/2019.     Objective: Blood pressure 117/61, pulse 97, temperature (!) 96.8 F (36 C), temperature source Temporal, height 5\' 6"  (1.676 m), weight 254 lb (115.2 kg). Patient is alert and in no acute distress. She is wearing facial mask. Conjunctiva is pink. Sclera is nonicteric Oropharyngeal mucosa is normal. No neck masses or thyromegaly noted. Cardiac exam with regular rhythm normal S1 and S2. No murmur or gallop noted. Lungs are clear to auscultation. Abdomen is protuberant.  Bowel sounds are normal.  Abdomen is soft.  She has mild tenderness in left upper and left lower quadrants.  There is no organomegaly or masses. She has nonpitting edema to both ankles.  No clubbing.   Assessment:  #1.  Irritable bowel syndrome.  Patient has typical symptoms of IBS with diarrhea but she has not responded to low-dose antispasmodic.  Her son has celiac disease but she tested negative for celiac disease(antibody panel).  She had colonoscopy with biopsy in March 2021 and biopsy was negative for lymphocytic or collagenous colitis.  I wonder if Metformin is contributing to her symptoms. If she does not respond to any therapy then we would start looking for other causes of her symptomatology.  #2.  Weight loss.  Weight loss most likely related to loss of appetite due to her medications.  If weight loss continues will look further.   Plan:  Medication list updated.  Dicyclomine deleted. Levbid/hyoscyamine 0.375 mg by mouth 30 minutes before breakfast daily. Xifaxan 550 mg by mouth 3 times a day after each meal for 2 weeks. Patient advised to keep symptom diary as to frequency and consistency of stools and call office with progress report in 3 to 4 weeks. Office visit in 3 months.

## 2020-01-29 ENCOUNTER — Other Ambulatory Visit (INDEPENDENT_AMBULATORY_CARE_PROVIDER_SITE_OTHER): Payer: Self-pay | Admitting: Internal Medicine

## 2020-02-05 ENCOUNTER — Ambulatory Visit (INDEPENDENT_AMBULATORY_CARE_PROVIDER_SITE_OTHER): Payer: Medicare Other | Admitting: Gastroenterology

## 2020-02-05 ENCOUNTER — Other Ambulatory Visit: Payer: Self-pay

## 2020-02-05 ENCOUNTER — Encounter (INDEPENDENT_AMBULATORY_CARE_PROVIDER_SITE_OTHER): Payer: Self-pay | Admitting: Gastroenterology

## 2020-02-05 VITALS — BP 151/82 | HR 94 | Temp 99.6°F | Ht 66.0 in | Wt 245.0 lb

## 2020-02-05 DIAGNOSIS — R197 Diarrhea, unspecified: Secondary | ICD-10-CM

## 2020-02-05 DIAGNOSIS — K219 Gastro-esophageal reflux disease without esophagitis: Secondary | ICD-10-CM

## 2020-02-05 NOTE — Progress Notes (Signed)
Patient profile: Ashlee Leblanc is a 69 y.o. female seen for f/up of diarrhea.   History of Present Illness: Ashlee Leblanc is seen today for follow-up.  She reports a drastic improvement in symptoms since starting the levbid once a day each morning.  She took a 2-week course of Xifaxan but did not note this to improve symptoms.  She has continued the Levbid and feels her stools are doing well with usually 2-3 bowel movements a day.  She denies any blood in stool or significant abdominal pain.  No alternating constipation.  Feels that Muir works better than dicyclomine did.  Very happy with results.  GERD well-controlled on low-dose omeprazole.  No breakthrough symptoms.  No nausea, vomiting, dysphagia.  She is having some issues with sciatica in her left leg and is taking some NSAIDs occasionally on empty stomach.   Wt Readings from Last 3 Encounters:  02/05/20 245 lb (111.1 kg)  11/04/19 254 lb (115.2 kg)  09/04/19 268 lb (121.6 kg)     Last Colonoscopy: - 09/04/19--Perianal rash found on perianal exam. - Perianal skin tags found on perianal exam. - The examined portion of the ileum was normal. - Diverticulosis in the sigmoid colon. - External hemorrhoids. - Biopsies were taken with a cold forceps from the ascending colon and sigmoid colon for evaluation of microscopic colitis. No evidence of endoscopic colitis. Next colonoscopy in 5 years. Patient has had adenomas and her brother had colon carcinoma.     Past Medical History:  Past Medical History:  Diagnosis Date  . Diabetes ([place])   . GERD (gastroesophageal reflux disease)   . HLD (hyperlipidemia)   . HTN (hypertension)     Problem List: Patient Active Problem List   Diagnosis Date Noted  . Irritable bowel syndrome (IBS) 11/04/2019    Past Surgical History: Past Surgical History:  Procedure Laterality Date  . BIOPSY  09/04/2019   Procedure: BIOPSY;  Surgeon: Rogene Houston, MD;  [facility]: [facility];   Service: Endoscopy;;  random colon  . CHOLECYSTECTOMY  2015  . COLONOSCOPY N/A 09/04/2019   Procedure: COLONOSCOPY;  Surgeon: Rogene Houston, MD;  Location: [facility];  Service: Endoscopy;  Laterality: N/A;  1250  . REPLACEMENT TOTAL KNEE BILATERAL    . ROTATOR CUFF REPAIR     bilateral at different dates    Allergies: Allergies  Allergen Reactions  . Benadryl [Diphenhydramine] Other (See Comments)    Patient calls it the jerks.  . Darvon [Propoxyphene] Other (See Comments)    Patient passed out  . Morphine And Related Hives  . Tetracyclines & Related Other (See Comments)    Bad chest pain and burning      Home Medications:  Current Outpatient Medications:  .  Ascorbic Acid (VITAMIN C WITH ROSE HIPS) 500 MG tablet, Take 500 mg by mouth daily., Disp: , Rfl:  .  Calcium Carbonate-Vitamin D (CALCIUM 600+D PO), Take 1 tablet by mouth daily. , Disp: , Rfl:  .  Cholecalciferol (VITAMIN D-3) 125 MCG (5000 UT) TABS, Take 5,000 Units by mouth daily., Disp: , Rfl:  .  citalopram (CELEXA) 20 MG tablet, Take 20 mg by mouth daily., Disp: , Rfl:  .  enalapril (VASOTEC) 10 MG tablet, Take 10 mg by mouth daily., Disp: , Rfl:  .  glipiZIDE (GLUCOTROL XL) 5 MG 24 hr tablet, Take 5 mg by mouth 2 (two) times daily., Disp: , Rfl:  .  Glucosamine-Chondroitin (MOVE FREE PO), Take 1  tablet by mouth daily. Move Freely, Disp: , Rfl:  .  hydrochlorothiazide (MICROZIDE) 12.5 MG capsule, Take 12.5 mg by mouth daily., Disp: , Rfl:  .  hyoscyamine (LEVBID) 0.375 MG 12 hr tablet, TAKE 1 TABLET (0.375 MG TOTAL) BY MOUTH DAILY BEFORE BREAKFAST. (NOT COVERED. DR DID NOT CHANGE), Disp: 90 tablet, Rfl: 1 .  MEGARED OMEGA-3 KRILL OIL PO, Take 1 capsule by mouth daily. , Disp: , Rfl:  .  metFORMIN (GLUCOPHAGE) 1000 MG tablet, Take 1,000 mg by mouth 2 (two) times daily with a meal., Disp: , Rfl:  .  mometasone (NASONEX) 50 MCG/ACT nasal spray, Place 2 sprays into the nose at bedtime. , Disp: , Rfl:  .  montelukast  (SINGULAIR) 10 MG tablet, Take 10 mg by mouth at bedtime., Disp: , Rfl:  .  Multiple Vitamin (MULTIVITAMIN WITH MINERALS) TABS tablet, Take 1 tablet by mouth daily., Disp: , Rfl:  .  omeprazole (PRILOSEC) 20 MG capsule, Take 20 mg by mouth daily as needed (acid reflux/indigestion.). , Disp: , Rfl:  .  pioglitazone (ACTOS) 45 MG tablet, Take 45 mg by mouth daily., Disp: , Rfl:  .  Probiotic Product (PROBIOTIC PO), Take 1 capsule by mouth daily. , Disp: , Rfl:  .  simvastatin (ZOCOR) 40 MG tablet, Take 40 mg by mouth every evening., Disp: , Rfl:  .  Specialty Vitamins Products (BRAIN PO), Take 1 tablet by mouth in the morning and at bedtime. Cognium Brain Health Tablets, Disp: , Rfl:  .  vitamin E (VITAMIN E) 180 MG (400 UNITS) capsule, Take 400 Units by mouth daily., Disp: , Rfl:    Family History: family history includes Colon cancer in her brother.    Social History:   reports that she has never smoked. She has never used smokeless tobacco. She reports that she does not drink alcohol and does not use drugs.   Review of Systems: Constitutional: Denies weight loss/weight gain  Eyes: No changes in vision. ENT: No oral lesions, sore throat.  GI: see HPI.  Heme/Lymph: No easy bruising.  CV: No chest pain.  GU: No hematuria.  Integumentary: No rashes.  Neuro: No headaches.  Psych: No depression/anxiety.  Endocrine: No heat/cold intolerance.  Allergic/Immunologic: No urticaria.  Resp: No cough, SOB.  Musculoskeletal: No joint swelling.    Physical Examination: BP (!) 151/82 (BP Location: Right Arm, Patient Position: Sitting, Cuff Size: Normal)   Pulse 94   Temp 99.6 F (37.6 C)   Ht 5\' 6"  (1.676 m)   Wt 245 lb (111.1 kg)   BMI 39.54 kg/m  Gen: NAD, alert and oriented x 4 HEENT: PEERLA, EOMI, Neck: supple, no JVD Chest: CTA bilaterally, no wheezes, crackles, or other adventitious sounds CV: RRR, no m/g/c/r Abd: soft, NT, ND, +BS in all four quadrants; no HSM, guarding, ridigity,  or rebound tenderness Ext: no edema, well perfused with 2+ pulses, Skin: no rash or lesions noted on observed skin Lymph: no noted LAD  Data Reviewed:   February 2020-negative celiac panel, GI pathogen panel  Assessment/Plan: Ashlee Leblanc is a 69 y.o. female seen for follow-up of diarrhea.     1.  [place] likely related to irritable bowel.  Colonoscopy was negative for microscopic colitis.  Infectious stool studies and celiac negative.  Drastically improved with Levbid which she will continue.  She is compliant with diet modifications.  She feels she is doing remarkably well  2.  GERD-no upper GI symptoms on low-dose PPI.  She has lost about  20 pounds since her last visit, though she is eating smaller meals.   Follow-up 1 year-sooner if needed   Loveta was seen today for irritable bowel syndrome.  Diagnoses and all orders for this visit:  Chronic GERD  Diarrhea, unspecified type     I personally performed the service, non-incident to. (WP)  Laurine Blazer, [facility]

## 2020-02-05 NOTE — Patient Instructions (Signed)
Continue medication as prescribed if working well. Please call w/ any issues

## 2020-03-25 ENCOUNTER — Other Ambulatory Visit: Payer: Self-pay | Admitting: Neurosurgery

## 2020-03-29 NOTE — Progress Notes (Signed)
[id number] [address], [place] [address] Phone: [phone] Fax: [phone]      Your procedure is scheduled on October 22  Report to [facility] Main Entrance "A" at 1230 P.M., and check in at the Admitting office.  Call this number if you have problems the morning of surgery:  [phone]  Call [phone] if you have any questions prior to your surgery date Monday-Friday 8am-4pm    Remember:  Do not eat or drink after midnight the night before your surgery     Take these medicines the morning of surgery with A SIP OF WATER  acetaminophen (TYLENOL)  If needed citalopram (CELEXA) hyoscyamine (LEVBID mometasone (NASONEX)  If needed omeprazole (PRILOSEC)   As of today, STOP taking any Aspirin (unless otherwise instructed by your surgeon) Aleve, Naproxen, Ibuprofen, Motrin, Advil, Goody's, BC's, all herbal medications, fish oil, and all vitamins.   WHAT DO I DO ABOUT MY DIABETES MEDICATION?   Marland Kitchen Do not take oral diabetes medicines (pills) the morning of surgery. glipiZIDE (GLUCOTROL XL),  metFORMIN (GLUCOPHAGE), pioglitazone (ACTOS)   HOW TO MANAGE YOUR DIABETES BEFORE AND AFTER SURGERY  Why is it important to control my blood sugar before and after surgery? . Improving blood sugar levels before and after surgery helps healing and can limit problems. . A way of improving blood sugar control is eating a healthy diet by: o  Eating less sugar and carbohydrates o  Increasing activity/exercise o  Talking with your doctor about reaching your blood sugar goals . High blood sugars (greater than 180 mg/dL) can raise your risk of infections and slow your recovery, so you will need to focus on controlling your diabetes during the weeks before surgery. . Make sure that the doctor who takes care of your diabetes knows about your planned surgery including the date and location.  How do I manage my  blood sugar before surgery? . Check your blood sugar at least 4 times a day, starting 2 days before surgery, to make sure that the level is not too high or low. . Check your blood sugar the morning of your surgery when you wake up and every 2 hours until you get to the Short Stay unit. o If your blood sugar is less than 70 mg/dL, you will need to treat for low blood sugar: - Do not take insulin. - Treat a low blood sugar (less than 70 mg/dL) with  cup of clear juice (cranberry or apple), 4 glucose tablets, OR glucose gel. - Recheck blood sugar in 15 minutes after treatment (to make sure it is greater than 70 mg/dL). If your blood sugar is not greater than 70 mg/dL on recheck, call [phone] for further instructions. . Report your blood sugar to the short stay nurse when you get to Short Stay.  . If you are admitted to the hospital after surgery: o Your blood sugar will be checked by the staff and you will probably be given insulin after surgery (instead of oral diabetes medicines) to make sure you have good blood sugar levels. o The goal for blood sugar control after surgery is 80-180 mg/dL.                     Do not wear jewelry, make up, or nail polish            Do not wear lotions, powders, perfumes/colognes, or deodorant.  Do not shave 48 hours prior to surgery.  Men may shave face and neck.            Do not bring valuables to the hospital.            [facility] is not responsible for any belongings or valuables.  Do NOT Smoke (Tobacco/Vaping) or drink Alcohol 24 hours prior to your procedure If you use a CPAP at night, you may bring all equipment for your overnight stay.   Contacts, glasses, dentures or bridgework may not be worn into surgery.      For patients admitted to the hospital, discharge time will be determined by your treatment team.   Patients discharged the day of surgery will not be allowed to drive home, and someone needs to stay with them for 24  hours.    Special instructions:   [facility]- Preparing For Surgery  Before surgery, you can play an important role. Because skin is not sterile, your skin needs to be as free of germs as possible. You can reduce the number of germs on your skin by washing with CHG (chlorahexidine gluconate) Soap before surgery.  CHG is an antiseptic cleaner which kills germs and bonds with the skin to continue killing germs even after washing.    Oral Hygiene is also important to reduce your risk of infection.  Remember - BRUSH YOUR TEETH THE MORNING OF SURGERY WITH YOUR REGULAR TOOTHPASTE  Please do not use if you have an allergy to CHG or antibacterial soaps. If your skin becomes reddened/irritated stop using the CHG.  Do not shave (including legs and underarms) for at least 48 hours prior to first CHG shower. It is OK to shave your face.  Please follow these instructions carefully.   1. Shower the NIGHT BEFORE SURGERY and the MORNING OF SURGERY with CHG Soap.   2. If you chose to wash your hair, wash your hair first as usual with your normal shampoo.  3. After you shampoo, rinse your hair and body thoroughly to remove the shampoo.  4. Use CHG as you would any other liquid soap. You can apply CHG directly to the skin and wash gently with a scrungie or a clean washcloth.   5. Apply the CHG Soap to your body ONLY FROM THE NECK DOWN.  Do not use on open wounds or open sores. Avoid contact with your eyes, ears, mouth and genitals (private parts). Wash Face and genitals (private parts)  with your normal soap.   6. Wash thoroughly, paying special attention to the area where your surgery will be performed.  7. Thoroughly rinse your body with warm water from the neck down.  8. DO NOT shower/wash with your normal soap after using and rinsing off the CHG Soap.  9. Pat yourself dry with a CLEAN TOWEL.  10. Wear CLEAN PAJAMAS to bed the night before surgery  11. Place CLEAN SHEETS on your bed the night of  your first shower and DO NOT SLEEP WITH PETS.   Day of Surgery: Wear Clean/Comfortable clothing the morning of surgery Do not apply any deodorants/lotions.   Remember to brush your teeth WITH YOUR REGULAR TOOTHPASTE.   Please read over the following fact sheets that you were given.

## 2020-03-30 ENCOUNTER — Other Ambulatory Visit: Payer: Self-pay

## 2020-03-30 ENCOUNTER — Encounter (HOSPITAL_COMMUNITY)
Admission: RE | Admit: 2020-03-30 | Discharge: 2020-03-30 | Disposition: A | Discharge status: Home / Self Care | Payer: Medicare Other | Source: Ambulatory Visit | Attending: Neurosurgery | Admitting: Neurosurgery

## 2020-03-30 ENCOUNTER — Other Ambulatory Visit (HOSPITAL_COMMUNITY)
Admission: RE | Admit: 2020-03-30 | Discharge: 2020-03-30 | Disposition: A | Discharge status: Home / Self Care | Payer: Medicare Other | Source: Ambulatory Visit | Attending: Neurosurgery | Admitting: Neurosurgery

## 2020-03-30 ENCOUNTER — Encounter (HOSPITAL_COMMUNITY): Payer: Self-pay

## 2020-03-30 DIAGNOSIS — Z01812 Encounter for preprocedural laboratory examination: Secondary | ICD-10-CM | POA: Insufficient documentation

## 2020-03-30 DIAGNOSIS — Z01818 Encounter for other preprocedural examination: Secondary | ICD-10-CM | POA: Diagnosis present

## 2020-03-30 DIAGNOSIS — E119 Type 2 diabetes mellitus without complications: Secondary | ICD-10-CM | POA: Diagnosis not present

## 2020-03-30 DIAGNOSIS — I10 Essential (primary) hypertension: Secondary | ICD-10-CM | POA: Insufficient documentation

## 2020-03-30 DIAGNOSIS — Z20822 Contact with and (suspected) exposure to covid-19: Secondary | ICD-10-CM | POA: Insufficient documentation

## 2020-03-30 HISTORY — DX: Claustrophobia: F40.240

## 2020-03-30 HISTORY — DX: Other complications of anesthesia, initial encounter: T88.59XA

## 2020-03-30 HISTORY — DX: Malignant (primary) neoplasm, unspecified: C80.1

## 2020-03-30 HISTORY — DX: Unspecified osteoarthritis, unspecified site: M19.90

## 2020-03-30 HISTORY — DX: Personal history of other diseases of the digestive system: Z87.19

## 2020-03-30 LAB — BASIC METABOLIC PANEL
Anion gap: 13 (ref 5–15)
BUN: 19 mg/dL (ref 8–23)
CO2: 28 mmol/L (ref 22–32)
Calcium: 9.9 mg/dL (ref 8.9–10.3)
Chloride: 99 mmol/L (ref 98–111)
Creatinine, Ser: 0.8 mg/dL (ref 0.44–1.00)
GFR, Estimated: >60 mL/min (ref >60)
Glucose, Bld: 148 mg/dL — ABNORMAL HIGH (ref 70–99)
Potassium: 3.9 mmol/L (ref 3.5–5.1)
Sodium: 140 mmol/L (ref 135–145)

## 2020-03-30 LAB — CBC
HCT: 43.2 % (ref 36.0–46.0)
Hemoglobin: 13.8 g/dL (ref 12.0–15.0)
MCH: 30.7 pg (ref 26.0–34.0)
MCHC: 31.9 g/dL (ref 30.0–36.0)
MCV: 96 fL (ref 80.0–100.0)
Platelets: 216 10*3/uL (ref 150–400)
RBC: 4.5 MIL/uL (ref 3.87–5.11)
RDW: 11.9 % (ref 11.5–15.5)
WBC: 7.1 10*3/uL (ref 4.0–10.5)
nRBC: 0 % (ref 0.0–0.2)

## 2020-03-30 LAB — SARS CORONAVIRUS 2 (TAT 6-24 HRS): SARS Coronavirus 2: NEGATIVE

## 2020-03-30 LAB — SURGICAL PCR SCREEN
MRSA, PCR: POSITIVE — AB
Staphylococcus aureus: POSITIVE — AB

## 2020-03-30 LAB — GLUCOSE, CAPILLARY: Glucose-Capillary: 142 mg/dL — ABNORMAL HIGH (ref 70–99)

## 2020-03-30 NOTE — Progress Notes (Addendum)
PCP - Shari Prows, NP at [facility]. I'm request records.  Cardiologist - no  Chest x-ray - na  EKG - 03/30/20  Stress Test - denies  ECHO - denies  Cardiac Cath - denies   Sleep Study - 15 years ago- CPAP - no- claustrophobic cant wear mask  LABS-CBC, BMP, A1C, PCR.  ASA-no  ERAS-no  HA1C- 03/30/20 Fasting Blood Sugar - 180 Checks Blood Sugar __1 not every day___ times a day  Anesthesia-  Pt denies having chest pain, sob, or fever at this time. All instructions explained to the pt, with a verbal understanding of the material. Pt agrees to go over the instructions while at home for a better understanding. Pt also instructed to self quarantine after being tested for COVID-19. The opportunity to ask questions was provided.

## 2020-03-30 NOTE — Progress Notes (Signed)
Your procedure is scheduled on October 22  Report to [facility] Main Entrance "A" at 1230 P.M., and check in at the Admitting office.              Your surgery or procedure is scheduled to begin at 2:30 PM   Call this number if you have problems the morning of surgery:  [phone]( this is the pre- op desk)  Call [phone] if you have any questions prior to your surgery date Monday-Friday 8am-4pm    Remember:  Do not eat or drink after midnight the night before your surgery     Take these medicines the morning of surgery with A SIP OF WATER  citalopram (CELEXA) hyoscyamine (LEVBID omeprazole (PRILOSEC)   acetaminophen (TYLENOL)  If needed mometasone (NASONEX)  If needed  As of today, STOP taking any Aspirin (unless otherwise instructed by your surgeon) Aleve, Naproxen, Ibuprofen, Motrin, Advil, Goody's, BC's, all herbal medications, fish oil, and all vitamins.   WHAT DO I DO ABOUT MY DIABETES MEDICATION?  Do not take Glipizide the evening before surgery or the am of surgery.  . Do not take oral diabetes medicines (pills) the morning of surgery. glipiZIDE (GLUCOTROL XL),  metFORMIN (GLUCOPHAGE), pioglitazone (ACTOS)   HOW TO MANAGE YOUR DIABETES BEFORE AND AFTER SURGERY  Why is it important to control my blood sugar before and after surgery? . Improving blood sugar levels before and after surgery helps healing and can limit problems. . A way of improving blood sugar control is eating a healthy diet by: o  Eating less sugar and carbohydrates o  Increasing activity/exercise o  Talking with your doctor about reaching your blood sugar goals . High blood sugars (greater than 180 mg/dL) can raise your risk of infections and slow your recovery, so you will need to focus on controlling your diabetes during the weeks before surgery. . Make sure that the doctor who takes care of your diabetes knows about your planned surgery including the date and location.  How do I  manage my blood sugar before surgery? . Check your blood sugar at least 4 times a day, starting 2 days before surgery, to make sure that the level is not too high or low. . Check your blood sugar the morning of your surgery when you wake up and every 2 hours until you get to the Short Stay unit. o If your blood sugar is less than 70 mg/dL, you will need to treat for low blood sugar: - Do not take insulin. - Treat a low blood sugar (less than 70 mg/dL) with  cup of clear juice (cranberry or apple), 4 glucose tablets, OR glucose gel. - Recheck blood sugar in 15 minutes after treatment (to make sure it is greater than 70 mg/dL). If your blood sugar is not greater than 70 mg/dL on recheck, call [phone] for further instructions. . Report your blood sugar to the short stay nurse when you get to Short Stay.  . If you are admitted to the hospital after surgery: o Your blood sugar will be checked by the staff and you will probably be given insulin after surgery (instead of oral diabetes medicines) to make sure you have good blood sugar levels. o The goal for blood sugar control after surgery is 80-180 mg/dL.           Special instructions:   [facility]- Preparing For Surgery  Before surgery, you can play an important role. Because skin is not sterile,  your skin needs to be as free of germs as possible. You can reduce the number of germs on your skin by washing with CHG (chlorahexidine gluconate) Soap before surgery.  CHG is an antiseptic cleaner which kills germs and bonds with the skin to continue killing germs even after washing.    Oral Hygiene is also important to reduce your risk of infection.  Remember - BRUSH YOUR TEETH THE MORNING OF SURGERY WITH YOUR REGULAR TOOTHPASTE  Please do not use if you have an allergy to CHG or antibacterial soaps. If your skin becomes reddened/irritated stop using the CHG.  Do not shave (including legs and underarms) for at least 48 hours prior to first CHG  shower. It is OK to shave your face.  Please follow these instructions carefully.   1. Shower the NIGHT BEFORE SURGERY and the MORNING OF SURGERY with CHG Soap.   2. If you chose to wash your hair, wash your hair first as usual with your normal shampoo.  3. After you shampoo, wash your face and private area with the soap you use at home, then rinse your hair and body thoroughly to remove the shampoo and soap.  4. Use CHG as you would any other liquid soap. You can apply CHG directly to the skin and wash gently with a scrungie or a clean washcloth.   5. Apply the CHG Soap to your body ONLY FROM THE NECK DOWN.  Do not use on open wounds or open sores. Avoid contact with your eyes, ears, mouth and genitals (private parts).   6. Wash thoroughly, paying special attention to the area where your surgery will be performed.  7. Thoroughly rinse your body with warm water from the neck down.  8. DO NOT shower/wash with your normal soap after using and rinsing off the CHG Soap.  9. Pat yourself dry with a CLEAN TOWEL.  10. Wear CLEAN PAJAMAS to bed the night before surgery  11. Place CLEAN SHEETS on your bed the night of your first shower and DO NOT SLEEP WITH PETS.  Day of Surgery: Shower as instructed above. Wear Clean/Comfortable clothing the morning of surgery Do not apply any deodorants/lotions, powders, colognes Remember to brush your teeth WITH YOUR REGULAR TOOTHPASTE.            Do not wear jewelry, make up, or nail polish                      Do not shave 48 hours prior to surgery.              Do not bring valuables to the hospital.            [facility] is not responsible for any belongings or valuables.  Do NOT Smoke (Tobacco/Vaping) or drink Alcohol 24 hours prior to your procedure If you use a CPAP at night, you may bring all equipment for your overnight stay.   Contacts, glasses, dentures or bridgework may not be worn into surgery.      For patients admitted to the  hospital, discharge time will be determined by your treatment team.   Patients discharged the day of surgery will not be allowed to drive home, and someone needs to stay with them for 24 hours.  Please read over the  fact sheets that you were given.

## 2020-03-31 LAB — HEMOGLOBIN A1C
Hgb A1c MFr Bld: 8.7 % — ABNORMAL HIGH (ref 4.8–5.6)
Mean Plasma Glucose: 203 mg/dL

## 2020-03-31 NOTE — Anesthesia Preprocedure Evaluation (Addendum)
Anesthesia Evaluation  Patient identified by MRN, date of birth, ID band Patient awake    Reviewed: Allergy & Precautions, NPO status , Patient's Chart, lab work & pertinent test results  Airway Mallampati: II  TM Distance: <3 FB Neck ROM: Full    Dental no notable dental hx.    Pulmonary sleep apnea , former smoker,    Pulmonary exam normal breath sounds clear to auscultation + decreased breath sounds      Cardiovascular hypertension, Normal cardiovascular exam Rhythm:Regular Rate:Normal     Neuro/Psych negative neurological ROS  negative psych ROS   GI/Hepatic Neg liver ROS, GERD  ,  Endo/Other  diabetesMorbid obesity  Renal/GU negative Renal ROS  negative genitourinary   Musculoskeletal negative musculoskeletal ROS (+)   Abdominal (+) + obese,   Peds negative pediatric ROS (+)  Hematology negative hematology ROS (+)   Anesthesia Other Findings   Reproductive/Obstetrics negative OB ROS                            Anesthesia Physical Anesthesia Plan  ASA: III  Anesthesia Plan: General   Post-op Pain Management:    Induction: Intravenous  PONV Risk Score and Plan: 4 or greater and Ondansetron, Dexamethasone, Midazolam and Treatment may vary due to age or medical condition  Airway Management Planned: Oral ETT  Additional Equipment:   Intra-op Plan:   Post-operative Plan: Extubation in OR  Informed Consent: I have reviewed the patients History and Physical, chart, labs and discussed the procedure including the risks, benefits and alternatives for the proposed anesthesia with the patient or authorized representative who has indicated his/her understanding and acceptance.     Dental advisory given  Plan Discussed with: CRNA and Surgeon  Anesthesia Plan Comments: (PAT note written 03/31/2020 by Myra Gianotti, PA-C. )       Anesthesia Quick Evaluation

## 2020-03-31 NOTE — Progress Notes (Signed)
Anesthesia Chart Review:  Case: [id number] Date/Time: 04/02/20 1420   Procedure: Right Lumbar 3-4 Microdiscectomy (Right ) - 3C/RM 21   Anesthesia type: General   Pre-op diagnosis: Lumbar herniated nucleus pulposus   Location: [facility] OR ROOM 21 / [facility]   Surgeons: Erline Levine, MD      DISCUSSION: Patient is a 69 year old female scheduled for the above procedure.  History includes former smoker, HTN, HLD, DM2, GERD, hiatal hernia, OSA (intolerant to CPAP due to claustrophobia). She reported "cancer within [colon] polyp" and undergoes surveillance colonoscopies, last 09/04/19 with no significant histopathologic changes seen on random colon biopsy and repeat colonoscopy in 5 years recommended.  BMI is consistent with morbid obesity. She recalled an experience of waking up from anesthesia prior to leaving the OR.   03/30/2020 presurgical COVID-19 test negative.  Anesthesia team to evaluate on the day of surgery.   VS: BP (!) 122/57   Pulse 89   Temp 36.7 C (Oral)   Resp 19   Ht 5\' 6"  (1.676 m)   Wt 112.5 kg   SpO2 98%   BMI 40.03 kg/m    PROVIDERS: Ephriam Jenkins E is PCP ([place]). 10/31/19 office note reviewed.  Hildred Laser, MD is GI    LABS: Preoperative labs noted. A1c 8.7%, up from 8.5% on 10/27/19 ([place]). A1c result routed to Dr. Vertell Limber. Patient reports fasting CBGs ~ 180. (all labs ordered are listed, but only abnormal results are displayed)  Labs Reviewed  SURGICAL PCR SCREEN - Abnormal; Notable for the following components:      Result Value   MRSA, PCR POSITIVE (*)    Staphylococcus aureus POSITIVE (*)    All other components within normal limits  GLUCOSE, CAPILLARY - Abnormal; Notable for the following components:   Glucose-Capillary 142 (*)    All other components within normal limits  HEMOGLOBIN A1C - Abnormal; Notable for the following components:   Hgb A1c MFr Bld 8.7 (*)    All other components within normal limits  BASIC METABOLIC PANEL -  Abnormal; Notable for the following components:   Glucose, Bld 148 (*)    All other components within normal limits  CBC     EKG: 03/30/20: Normal sinus rhythm Nonspecific ST and T wave abnormality Abnormal ECG No old tracing to compare Confirmed by Daneen Schick [phone]) on 03/30/2020 3:26:01 PM   CV: N/A   Past Medical History:  Diagnosis Date  . Arthritis   . Cancer ([place])    polyp- colon- contained in polyp.  . Claustrophobia   . Complication of anesthesia    " hard time Knocking me out." "I wake up before I leave the OR."  . Diabetes ([place])    Type II  . GERD (gastroesophageal reflux disease)   . History of hiatal hernia   . HLD (hyperlipidemia)   . HTN (hypertension)   . Sleep apnea 2001   can't wear mask - claystrophbic    Past Surgical History:  Procedure Laterality Date  . BIOPSY  09/04/2019   Procedure: BIOPSY;  Surgeon: Rogene Houston, MD;  Location: [facility];  Service: Endoscopy;;  random colon  . CHOLECYSTECTOMY  2015  . COLONOSCOPY N/A 09/04/2019   Procedure: COLONOSCOPY;  Surgeon: Rogene Houston, MD;  Location: [facility];  Service: Endoscopy;  Laterality: N/A;  1250  . Neuromoa Left    foot  . PLANTAR FASCIA SURGERY Bilateral   . REPLACEMENT TOTAL KNEE BILATERAL    . ROTATOR  CUFF REPAIR     bilateral at different dates    MEDICATIONS: . nystatin-triamcinolone (MYCOLOG II) cream  . acetaminophen (TYLENOL) 500 MG tablet  . Ascorbic Acid (VITAMIN C WITH ROSE HIPS) 500 MG tablet  . Calcium Carbonate-Vitamin D (CALCIUM 600+D PO)  . Cholecalciferol (VITAMIN D-3) 125 MCG (5000 UT) TABS  . citalopram (CELEXA) 20 MG tablet  . enalapril (VASOTEC) 10 MG tablet  . glipiZIDE (GLUCOTROL XL) 5 MG 24 hr tablet  . Glucosamine-Chondroitin (MOVE FREE PO)  . hydrochlorothiazide (MICROZIDE) 12.5 MG capsule  . hyoscyamine (LEVBID) 0.375 MG 12 hr tablet  . ibuprofen (ADVIL) 200 MG tablet  . MEGARED OMEGA-3 KRILL OIL PO  . metFORMIN (GLUCOPHAGE) 1000 MG  tablet  . mometasone (NASONEX) 50 MCG/ACT nasal spray  . montelukast (SINGULAIR) 10 MG tablet  . Multiple Vitamin (MULTIVITAMIN WITH MINERALS) TABS tablet  . omeprazole (PRILOSEC) 20 MG capsule  . pioglitazone (ACTOS) 45 MG tablet  . Probiotic Product (PROBIOTIC PO)  . simvastatin (ZOCOR) 40 MG tablet  . Specialty Vitamins Products (BRAIN PO)  . vitamin E (VITAMIN E) 180 MG (400 UNITS) capsule   No current facility-administered medications for this encounter.    Myra Gianotti, PA-C Surgical Short Stay/Anesthesiology [facility] Phone [phone] [facility] Phone [phone] 03/31/2020 1:36 PM

## 2020-04-01 MED ORDER — VANCOMYCIN HCL 1500 MG/300ML IV SOLN
1500.0000 mg | INTRAVENOUS | Status: AC
  Administered 2020-04-02 (×2): 1500 mg via INTRAVENOUS
  Filled 2020-04-01: qty 300

## 2020-04-02 ENCOUNTER — Ambulatory Visit (HOSPITAL_COMMUNITY): Payer: Medicare Other | Admitting: Certified Registered"

## 2020-04-02 ENCOUNTER — Encounter (HOSPITAL_COMMUNITY): Payer: Self-pay | Admitting: Neurosurgery

## 2020-04-02 ENCOUNTER — Ambulatory Visit (HOSPITAL_COMMUNITY): Payer: Medicare Other | Admitting: Vascular Surgery

## 2020-04-02 ENCOUNTER — Other Ambulatory Visit: Payer: Self-pay

## 2020-04-02 ENCOUNTER — Observation Stay (HOSPITAL_COMMUNITY)
Admission: RE | Admit: 2020-04-02 | Discharge: 2020-04-03 | Disposition: A | Discharge status: Home / Self Care | Payer: Medicare Other | Attending: Neurological Surgery | Admitting: Neurological Surgery

## 2020-04-02 ENCOUNTER — Ambulatory Visit (HOSPITAL_COMMUNITY): Payer: Medicare Other

## 2020-04-02 ENCOUNTER — Encounter (HOSPITAL_COMMUNITY)
Admission: RE | Disposition: A | Discharge status: Home / Self Care | Payer: Self-pay | Source: Home / Self Care | Attending: Neurosurgery

## 2020-04-02 DIAGNOSIS — M5416 Radiculopathy, lumbar region: Secondary | ICD-10-CM | POA: Diagnosis not present

## 2020-04-02 DIAGNOSIS — Z419 Encounter for procedure for purposes other than remedying health state, unspecified: Secondary | ICD-10-CM

## 2020-04-02 DIAGNOSIS — M5126 Other intervertebral disc displacement, lumbar region: Secondary | ICD-10-CM | POA: Diagnosis not present

## 2020-04-02 DIAGNOSIS — M549 Dorsalgia, unspecified: Secondary | ICD-10-CM | POA: Diagnosis present

## 2020-04-02 HISTORY — PX: LUMBAR LAMINECTOMY/DECOMPRESSION MICRODISCECTOMY: SHX5026

## 2020-04-02 LAB — GLUCOSE, CAPILLARY
Glucose-Capillary: 160 mg/dL — ABNORMAL HIGH (ref 70–99)
Glucose-Capillary: 168 mg/dL — ABNORMAL HIGH (ref 70–99)
Glucose-Capillary: 354 mg/dL — ABNORMAL HIGH (ref 70–99)

## 2020-04-02 SURGERY — LUMBAR LAMINECTOMY/DECOMPRESSION MICRODISCECTOMY 1 LEVEL
Anesthesia: General | Site: Spine Lumbar | Laterality: Right

## 2020-04-02 MED ORDER — CEFAZOLIN SODIUM-DEXTROSE 2-4 GM/100ML-% IV SOLN
2.0000 g | INTRAVENOUS | Status: AC
  Administered 2020-04-02: 2 g via INTRAVENOUS
  Filled 2020-04-02: qty 100

## 2020-04-02 MED ORDER — VITAMIN E 45 MG (100 UNIT) PO CAPS
400.0000 [IU] | ORAL_CAPSULE | Freq: Every day | ORAL | Status: DC
  Filled 2020-04-02: qty 4

## 2020-04-02 MED ORDER — OXYCODONE HCL 5 MG/5ML PO SOLN: 5.0000 mg | Freq: Once | ORAL | Status: DC | PRN

## 2020-04-02 MED ORDER — PANTOPRAZOLE SODIUM 40 MG IV SOLR: 40.0000 mg | Freq: Every day | INTRAVENOUS | Status: DC

## 2020-04-02 MED ORDER — ENALAPRIL MALEATE 5 MG PO TABS
10.0000 mg | ORAL_TABLET | Freq: Every day | ORAL | Status: DC
  Filled 2020-04-02: qty 2

## 2020-04-02 MED ORDER — ZOLPIDEM TARTRATE 5 MG PO TABS: 5.0000 mg | ORAL_TABLET | Freq: Every evening | ORAL | Status: DC | PRN

## 2020-04-02 MED ORDER — INSULIN ASPART 100 UNIT/ML ~~LOC~~ SOLN
0.0000 [IU] | Freq: Every day | SUBCUTANEOUS | Status: DC
  Administered 2020-04-02: 5 [IU] via SUBCUTANEOUS

## 2020-04-02 MED ORDER — MENTHOL 3 MG MT LOZG: 1.0000 | LOZENGE | OROMUCOSAL | Status: DC | PRN

## 2020-04-02 MED ORDER — MIDAZOLAM HCL 2 MG/2ML IJ SOLN
INTRAMUSCULAR | Status: AC
  Filled 2020-04-02: qty 2

## 2020-04-02 MED ORDER — ACETAMINOPHEN 500 MG PO TABS: 1000.0000 mg | ORAL_TABLET | Freq: Four times a day (QID) | ORAL | Status: DC | PRN

## 2020-04-02 MED ORDER — BUPIVACAINE HCL (PF) 0.5 % IJ SOLN
INTRAMUSCULAR | Status: DC | PRN
  Administered 2020-04-02: 5 mL

## 2020-04-02 MED ORDER — ORAL CARE MOUTH RINSE: 15.0000 mL | Freq: Once | OROMUCOSAL | Status: AC

## 2020-04-02 MED ORDER — PHENOL 1.4 % MT LIQD: 1.0000 | OROMUCOSAL | Status: DC | PRN

## 2020-04-02 MED ORDER — ONDANSETRON HCL 4 MG PO TABS: 4.0000 mg | ORAL_TABLET | Freq: Four times a day (QID) | ORAL | Status: DC | PRN

## 2020-04-02 MED ORDER — ALUM & MAG HYDROXIDE-SIMETH 200-200-20 MG/5ML PO SUSP: 30.0000 mL | Freq: Four times a day (QID) | ORAL | Status: DC | PRN

## 2020-04-02 MED ORDER — PIOGLITAZONE HCL 45 MG PO TABS
45.0000 mg | ORAL_TABLET | Freq: Every day | ORAL | Status: DC
  Filled 2020-04-02: qty 1

## 2020-04-02 MED ORDER — HYDROCODONE-ACETAMINOPHEN 5-325 MG PO TABS
1.0000 | ORAL_TABLET | ORAL | Status: DC | PRN
  Administered 2020-04-02 – 2020-04-03 (×2): 2 via ORAL
  Filled 2020-04-02 (×2): qty 1
  Filled 2020-04-02: qty 2

## 2020-04-02 MED ORDER — THROMBIN 5000 UNITS EX SOLR
OROMUCOSAL | Status: DC | PRN
  Administered 2020-04-02: 5 mL via TOPICAL

## 2020-04-02 MED ORDER — ACETAMINOPHEN 10 MG/ML IV SOLN
INTRAVENOUS | Status: AC
  Filled 2020-04-02: qty 100

## 2020-04-02 MED ORDER — HYDROMORPHONE HCL 1 MG/ML IJ SOLN: 0.2500 mg | INTRAMUSCULAR | Status: DC | PRN

## 2020-04-02 MED ORDER — FLUTICASONE PROPIONATE 50 MCG/ACT NA SUSP: 2.0000 | Freq: Every day | NASAL | Status: DC

## 2020-04-02 MED ORDER — DEXAMETHASONE SODIUM PHOSPHATE 10 MG/ML IJ SOLN
INTRAMUSCULAR | Status: DC | PRN
  Administered 2020-04-02: 10 mg via INTRAVENOUS

## 2020-04-02 MED ORDER — HYOSCYAMINE SULFATE ER 0.375 MG PO TB12
0.3750 mg | ORAL_TABLET | Freq: Two times a day (BID) | ORAL | Status: DC | PRN
  Filled 2020-04-02: qty 1

## 2020-04-02 MED ORDER — PHENYLEPHRINE 40 MCG/ML (10ML) SYRINGE FOR IV PUSH (FOR BLOOD PRESSURE SUPPORT)
PREFILLED_SYRINGE | INTRAVENOUS | Status: DC | PRN
  Administered 2020-04-02: 80 ug via INTRAVENOUS

## 2020-04-02 MED ORDER — BISACODYL 10 MG RE SUPP: 10.0000 mg | Freq: Every day | RECTAL | Status: DC | PRN

## 2020-04-02 MED ORDER — SODIUM CHLORIDE 0.9 % IV SOLN: 250.0000 mL | INTRAVENOUS | Status: DC

## 2020-04-02 MED ORDER — MEGARED OMEGA-3 KRILL OIL 500 MG PO CAPS: ORAL_CAPSULE | Freq: Every day | ORAL | Status: DC

## 2020-04-02 MED ORDER — FLEET ENEMA 7-19 GM/118ML RE ENEM: 1.0000 | ENEMA | Freq: Once | RECTAL | Status: DC | PRN

## 2020-04-02 MED ORDER — POLYETHYLENE GLYCOL 3350 17 G PO PACK: 17.0000 g | PACK | Freq: Every day | ORAL | Status: DC | PRN

## 2020-04-02 MED ORDER — PHENYLEPHRINE HCL-NACL 10-0.9 MG/250ML-% IV SOLN
INTRAVENOUS | Status: DC | PRN
  Administered 2020-04-02: 25 ug/min via INTRAVENOUS

## 2020-04-02 MED ORDER — CALCIUM CARBONATE-VITAMIN D 500-200 MG-UNIT PO TABS: 1.0000 | ORAL_TABLET | Freq: Every day | ORAL | Status: DC

## 2020-04-02 MED ORDER — INSULIN ASPART 100 UNIT/ML ~~LOC~~ SOLN: 0.0000 [IU] | Freq: Three times a day (TID) | SUBCUTANEOUS | Status: DC

## 2020-04-02 MED ORDER — OXYCODONE HCL 5 MG PO TABS: 5.0000 mg | ORAL_TABLET | ORAL | Status: DC | PRN

## 2020-04-02 MED ORDER — HYDROCHLOROTHIAZIDE 12.5 MG PO CAPS: 12.5000 mg | ORAL_CAPSULE | Freq: Every day | ORAL | Status: DC

## 2020-04-02 MED ORDER — THROMBIN 5000 UNITS EX SOLR
CUTANEOUS | Status: AC
  Filled 2020-04-02: qty 5000

## 2020-04-02 MED ORDER — ACETAMINOPHEN 10 MG/ML IV SOLN
INTRAVENOUS | Status: DC | PRN
  Administered 2020-04-02: 1000 mg via INTRAVENOUS

## 2020-04-02 MED ORDER — DOCUSATE SODIUM 100 MG PO CAPS
100.0000 mg | ORAL_CAPSULE | Freq: Two times a day (BID) | ORAL | Status: DC
  Filled 2020-04-02: qty 1

## 2020-04-02 MED ORDER — MONTELUKAST SODIUM 10 MG PO TABS
10.0000 mg | ORAL_TABLET | Freq: Every day | ORAL | Status: DC
  Administered 2020-04-02: 10 mg via ORAL
  Filled 2020-04-02: qty 1

## 2020-04-02 MED ORDER — GLIPIZIDE ER 5 MG PO TB24
5.0000 mg | ORAL_TABLET | Freq: Two times a day (BID) | ORAL | Status: DC
  Filled 2020-04-02: qty 1

## 2020-04-02 MED ORDER — INSULIN ASPART 100 UNIT/ML ~~LOC~~ SOLN
0.0000 [IU] | Freq: Three times a day (TID) | SUBCUTANEOUS | Status: DC
  Administered 2020-04-03: 8 [IU] via SUBCUTANEOUS

## 2020-04-02 MED ORDER — LIDOCAINE-EPINEPHRINE 1 %-1:100000 IJ SOLN
INTRAMUSCULAR | Status: DC | PRN
  Administered 2020-04-02: 5 mL

## 2020-04-02 MED ORDER — MIDAZOLAM HCL 5 MG/5ML IJ SOLN
INTRAMUSCULAR | Status: DC | PRN
  Administered 2020-04-02: 2 mg via INTRAVENOUS

## 2020-04-02 MED ORDER — METFORMIN HCL 500 MG PO TABS
1000.0000 mg | ORAL_TABLET | Freq: Two times a day (BID) | ORAL | Status: DC
  Filled 2020-04-02: qty 2

## 2020-04-02 MED ORDER — FENTANYL CITRATE (PF) 100 MCG/2ML IJ SOLN
INTRAMUSCULAR | Status: AC
  Filled 2020-04-02: qty 2

## 2020-04-02 MED ORDER — ACETAMINOPHEN 10 MG/ML IV SOLN: 1000.0000 mg | Freq: Once | INTRAVENOUS | Status: DC | PRN

## 2020-04-02 MED ORDER — SODIUM CHLORIDE 0.9% FLUSH: 3.0000 mL | INTRAVENOUS | Status: DC | PRN

## 2020-04-02 MED ORDER — ASCORBIC ACID 500 MG PO TABS: 500.0000 mg | ORAL_TABLET | Freq: Every day | ORAL | Status: DC

## 2020-04-02 MED ORDER — METHYLPREDNISOLONE ACETATE 80 MG/ML IJ SUSP
INTRAMUSCULAR | Status: AC
  Filled 2020-04-02: qty 1

## 2020-04-02 MED ORDER — PROBIOTIC PO CAPS: ORAL_CAPSULE | Freq: Every day | ORAL | Status: DC

## 2020-04-02 MED ORDER — PANTOPRAZOLE SODIUM 40 MG PO TBEC: 40.0000 mg | DELAYED_RELEASE_TABLET | Freq: Every day | ORAL | Status: DC

## 2020-04-02 MED ORDER — KCL IN DEXTROSE-NACL 20-5-0.45 MEQ/L-%-% IV SOLN: INTRAVENOUS | Status: DC

## 2020-04-02 MED ORDER — LIDOCAINE-EPINEPHRINE 1 %-1:100000 IJ SOLN
INTRAMUSCULAR | Status: AC
  Filled 2020-04-02: qty 1

## 2020-04-02 MED ORDER — HYDROMORPHONE HCL 1 MG/ML IJ SOLN: 0.5000 mg | INTRAMUSCULAR | Status: DC | PRN

## 2020-04-02 MED ORDER — ADULT MULTIVITAMIN W/MINERALS CH: 1.0000 | ORAL_TABLET | Freq: Every day | ORAL | Status: DC

## 2020-04-02 MED ORDER — LACTATED RINGERS IV SOLN: INTRAVENOUS | Status: DC

## 2020-04-02 MED ORDER — SIMVASTATIN 20 MG PO TABS
40.0000 mg | ORAL_TABLET | Freq: Every evening | ORAL | Status: DC
  Administered 2020-04-02: 40 mg via ORAL
  Filled 2020-04-02: qty 2

## 2020-04-02 MED ORDER — METHYLPREDNISOLONE ACETATE 80 MG/ML IJ SUSP
INTRAMUSCULAR | Status: DC | PRN
  Administered 2020-04-02: 80 mg

## 2020-04-02 MED ORDER — ACETAMINOPHEN 325 MG PO TABS: 650.0000 mg | ORAL_TABLET | ORAL | Status: DC | PRN

## 2020-04-02 MED ORDER — LIDOCAINE 2% (20 MG/ML) 5 ML SYRINGE
INTRAMUSCULAR | Status: DC | PRN
  Administered 2020-04-02: 100 mg via INTRAVENOUS

## 2020-04-02 MED ORDER — CHLORHEXIDINE GLUCONATE 0.12 % MT SOLN
15.0000 mL | Freq: Once | OROMUCOSAL | Status: AC
  Administered 2020-04-02: 15 mL via OROMUCOSAL
  Filled 2020-04-02: qty 15

## 2020-04-02 MED ORDER — CHLORHEXIDINE GLUCONATE CLOTH 2 % EX PADS: 6.0000 | MEDICATED_PAD | Freq: Once | CUTANEOUS | Status: DC

## 2020-04-02 MED ORDER — FENTANYL CITRATE (PF) 100 MCG/2ML IJ SOLN
INTRAMUSCULAR | Status: DC | PRN
  Administered 2020-04-02: 100 ug via INTRAVENOUS

## 2020-04-02 MED ORDER — ONDANSETRON HCL 4 MG/2ML IJ SOLN: 4.0000 mg | Freq: Four times a day (QID) | INTRAMUSCULAR | Status: DC | PRN

## 2020-04-02 MED ORDER — FENTANYL CITRATE (PF) 250 MCG/5ML IJ SOLN
INTRAMUSCULAR | Status: AC
  Filled 2020-04-02: qty 5

## 2020-04-02 MED ORDER — VITAMIN D-3 125 MCG (5000 UT) PO TABS: 5000.0000 [IU] | ORAL_TABLET | Freq: Every day | ORAL | Status: DC

## 2020-04-02 MED ORDER — BUPIVACAINE HCL (PF) 0.5 % IJ SOLN
INTRAMUSCULAR | Status: AC
  Filled 2020-04-02: qty 30

## 2020-04-02 MED ORDER — ONDANSETRON HCL 4 MG/2ML IJ SOLN
INTRAMUSCULAR | Status: DC | PRN
  Administered 2020-04-02: 4 mg via INTRAVENOUS

## 2020-04-02 MED ORDER — ACETAMINOPHEN 650 MG RE SUPP: 650.0000 mg | RECTAL | Status: DC | PRN

## 2020-04-02 MED ORDER — PROPOFOL 10 MG/ML IV BOLUS
INTRAVENOUS | Status: DC | PRN
  Administered 2020-04-02: 200 mg via INTRAVENOUS

## 2020-04-02 MED ORDER — SODIUM CHLORIDE 0.9% FLUSH
3.0000 mL | Freq: Two times a day (BID) | INTRAVENOUS | Status: DC
  Administered 2020-04-02: 3 mL via INTRAVENOUS

## 2020-04-02 MED ORDER — METHOCARBAMOL 500 MG PO TABS: 500.0000 mg | ORAL_TABLET | Freq: Four times a day (QID) | ORAL | Status: DC | PRN

## 2020-04-02 MED ORDER — EPHEDRINE SULFATE-NACL 50-0.9 MG/10ML-% IV SOSY
PREFILLED_SYRINGE | INTRAVENOUS | Status: DC | PRN
  Administered 2020-04-02: 5 mg via INTRAVENOUS

## 2020-04-02 MED ORDER — METHOCARBAMOL 1000 MG/10ML IJ SOLN
500.0000 mg | Freq: Four times a day (QID) | INTRAVENOUS | Status: DC | PRN
  Filled 2020-04-02: qty 5

## 2020-04-02 MED ORDER — OXYCODONE HCL 5 MG PO TABS: 5.0000 mg | ORAL_TABLET | Freq: Once | ORAL | Status: DC | PRN

## 2020-04-02 MED ORDER — ROCURONIUM BROMIDE 10 MG/ML (PF) SYRINGE
PREFILLED_SYRINGE | INTRAVENOUS | Status: DC | PRN
  Administered 2020-04-02: 70 mg via INTRAVENOUS

## 2020-04-02 MED ORDER — CITALOPRAM HYDROBROMIDE 20 MG PO TABS: 20.0000 mg | ORAL_TABLET | Freq: Every day | ORAL | Status: DC

## 2020-04-02 MED ORDER — CEFAZOLIN SODIUM-DEXTROSE 2-4 GM/100ML-% IV SOLN
2.0000 g | Freq: Three times a day (TID) | INTRAVENOUS | Status: AC
  Administered 2020-04-02 – 2020-04-03 (×2): 2 g via INTRAVENOUS
  Filled 2020-04-02 (×2): qty 100

## 2020-04-02 MED ORDER — NYSTATIN-TRIAMCINOLONE 100000-0.1 UNIT/GM-% EX CREA
1.0000 "application " | TOPICAL_CREAM | Freq: Three times a day (TID) | CUTANEOUS | Status: DC | PRN
  Filled 2020-04-02: qty 15

## 2020-04-02 MED ORDER — FENTANYL CITRATE (PF) 250 MCG/5ML IJ SOLN
INTRAMUSCULAR | Status: DC | PRN
  Administered 2020-04-02 (×3): 50 ug via INTRAVENOUS

## 2020-04-02 MED ORDER — 0.9 % SODIUM CHLORIDE (POUR BTL) OPTIME
TOPICAL | Status: DC | PRN
  Administered 2020-04-02: 1000 mL

## 2020-04-02 MED ORDER — SUGAMMADEX SODIUM 200 MG/2ML IV SOLN
INTRAVENOUS | Status: DC | PRN
  Administered 2020-04-02: 400 mg via INTRAVENOUS

## 2020-04-02 SURGICAL SUPPLY — 57 items
BAND RUBBER #18 3X1/16 STRL (MISCELLANEOUS) ×4 IMPLANT
BENZOIN TINCTURE PRP APPL 2/3 (GAUZE/BANDAGES/DRESSINGS) ×2 IMPLANT
BLADE CLIPPER SURG (BLADE) IMPLANT
BUR MATCHSTICK NEURO 3.0 LAGG (BURR) ×2 IMPLANT
BUR ROUND FLUTED 5 RND (BURR) ×2 IMPLANT
CANISTER SUCT 3000ML PPV (MISCELLANEOUS) ×2 IMPLANT
CARTRIDGE OIL MAESTRO DRILL (MISCELLANEOUS) ×1 IMPLANT
COVER WAND RF STERILE (DRAPES) IMPLANT
DECANTER SPIKE VIAL GLASS SM (MISCELLANEOUS) ×2 IMPLANT
DERMABOND ADVANCED (GAUZE/BANDAGES/DRESSINGS) ×1
DERMABOND ADVANCED .7 DNX12 (GAUZE/BANDAGES/DRESSINGS) ×1 IMPLANT
DIFFUSER DRILL AIR PNEUMATIC (MISCELLANEOUS) ×2 IMPLANT
DRAPE LAPAROTOMY 100X72X124 (DRAPES) ×2 IMPLANT
DRAPE MICROSCOPE LEICA (MISCELLANEOUS) ×2 IMPLANT
DRAPE SURG 17X23 STRL (DRAPES) ×2 IMPLANT
DRSG OPSITE POSTOP 4X6 (GAUZE/BANDAGES/DRESSINGS) ×2 IMPLANT
DURAPREP 26ML APPLICATOR (WOUND CARE) ×2 IMPLANT
ELECT BLADE 4.0 EZ CLEAN MEGAD (MISCELLANEOUS) ×2
ELECT REM PT RETURN 9FT ADLT (ELECTROSURGICAL) ×2
ELECTRODE BLDE 4.0 EZ CLN MEGD (MISCELLANEOUS) ×1 IMPLANT
ELECTRODE REM PT RTRN 9FT ADLT (ELECTROSURGICAL) ×1 IMPLANT
GAUZE 4X4 16PLY RFD (DISPOSABLE) IMPLANT
GAUZE SPONGE 4X4 12PLY STRL (GAUZE/BANDAGES/DRESSINGS) IMPLANT
GLOVE BIO SURGEON STRL SZ8 (GLOVE) ×2 IMPLANT
GLOVE BIOGEL PI IND STRL 7.5 (GLOVE) ×1 IMPLANT
GLOVE BIOGEL PI IND STRL 8 (GLOVE) ×1 IMPLANT
GLOVE BIOGEL PI IND STRL 8.5 (GLOVE) ×1 IMPLANT
GLOVE BIOGEL PI INDICATOR 7.5 (GLOVE) ×1
GLOVE BIOGEL PI INDICATOR 8 (GLOVE) ×1
GLOVE BIOGEL PI INDICATOR 8.5 (GLOVE) ×1
GLOVE ECLIPSE 8.0 STRL XLNG CF (GLOVE) ×2 IMPLANT
GLOVE EXAM NITRILE XL STR (GLOVE) IMPLANT
GLOVE SURG SS PI 7.0 STRL IVOR (GLOVE) ×6 IMPLANT
GOWN STRL REUS W/ TWL LRG LVL3 (GOWN DISPOSABLE) IMPLANT
GOWN STRL REUS W/ TWL XL LVL3 (GOWN DISPOSABLE) ×2 IMPLANT
GOWN STRL REUS W/TWL 2XL LVL3 (GOWN DISPOSABLE) ×2 IMPLANT
GOWN STRL REUS W/TWL LRG LVL3 (GOWN DISPOSABLE)
GOWN STRL REUS W/TWL XL LVL3 (GOWN DISPOSABLE) ×2
HEMOSTAT POWDER KIT SURGIFOAM (HEMOSTASIS) ×2 IMPLANT
KIT BASIN OR (CUSTOM PROCEDURE TRAY) ×2 IMPLANT
KIT TURNOVER KIT B (KITS) ×2 IMPLANT
NEEDLE HYPO 18GX1.5 BLUNT FILL (NEEDLE) IMPLANT
NEEDLE HYPO 25X1 1.5 SAFETY (NEEDLE) ×2 IMPLANT
NEEDLE SPNL 18GX3.5 QUINCKE PK (NEEDLE) ×2 IMPLANT
NS IRRIG 1000ML POUR BTL (IV SOLUTION) ×2 IMPLANT
OIL CARTRIDGE MAESTRO DRILL (MISCELLANEOUS) ×2
PACK LAMINECTOMY NEURO (CUSTOM PROCEDURE TRAY) ×2 IMPLANT
PAD ARMBOARD 7.5X6 YLW CONV (MISCELLANEOUS) ×10 IMPLANT
SPONGE SURGIFOAM ABS GEL SZ50 (HEMOSTASIS) IMPLANT
SUT VIC AB 0 CT1 18XCR BRD8 (SUTURE) ×1 IMPLANT
SUT VIC AB 0 CT1 8-18 (SUTURE) ×1
SUT VIC AB 2-0 CT1 18 (SUTURE) ×2 IMPLANT
SUT VIC AB 3-0 SH 8-18 (SUTURE) ×2 IMPLANT
SYR 5ML LL (SYRINGE) IMPLANT
TOWEL GREEN STERILE (TOWEL DISPOSABLE) ×2 IMPLANT
TOWEL GREEN STERILE FF (TOWEL DISPOSABLE) ×2 IMPLANT
WATER STERILE IRR 1000ML POUR (IV SOLUTION) ×2 IMPLANT

## 2020-04-02 NOTE — Transfer of Care (Signed)
Immediate Anesthesia Transfer of Care Note  Patient: Ashlee Leblanc  Procedure(s) Performed: Right Lumbar Three-Four Microdiscectomy (Right Spine Lumbar)  Patient Location: PACU  Anesthesia Type:General  Level of Consciousness: awake, alert  and oriented  Airway & Oxygen Therapy: Patient Spontanous Breathing and Patient connected to face mask oxygen  Post-op Assessment: Report given to RN and Post -op Vital signs reviewed and stable  Post vital signs: Reviewed and stable  Last Vitals:  Vitals Value Taken Time  BP 136/78 04/02/20 1632  Temp 36.6 C 04/02/20 1632  Pulse 102 04/02/20 1634  Resp 15 04/02/20 1634  SpO2 100 % 04/02/20 1634  Vitals shown include unvalidated device data.  Last Pain:  Vitals:   04/02/20 1632  TempSrc:   PainSc: (P) 0-No pain         Complications: No complications documented.

## 2020-04-02 NOTE — Brief Op Note (Signed)
04/02/2020  5:02 PM  PATIENT:  Ashlee Leblanc  69 y.o. 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 It was elected to take her to surgery for right L 34 laminectomy and microdiscectomy.    Procedure: Patient was brought to the operating room and following the smooth and uncomplicated induction of general endotracheal anesthesia she was placed in a prone position on the Wilson frame. Low back was prepped and draped in the usual sterile fashion with betadine scrub and DuraPrep. Preoperative localizing X ray confirmed correct level. Area of planned incision was infiltrated with local lidocaine. Incision was made in the midline and carried to the lumbodorsal fascia which was incised on the right side of midline. Subperiosteal dissection was performed exposing what was felt to be L 34 level. Intraoperative x-ray demonstrated marker probe at L 34.  A laminotomy of L 3 was performed as well as a foraminotomy overlying L 3. Ligamentum was mobilized and detached, exposing the common dural tube and  L 4 nerve root. The microscope was brought into the field and the L 4 nerve root was mobilized medially and carefully dissected from the large amount of degenerated ligament and synovial cyst  which was adherent to the dura.  Using microdissection, the material was removed and neural elements thoroughly decompressed. There was no violation of the dura.  A generous foraminotomy was performed along the course of the L 4 nerve root.  There did not appear to be a disc herniation as a cuse of the patient's radiculopathy.  Hemostasis was assured with bipolar electrocautery and the interspace was irrigated with Depo-Medrol and fentanyl. The lumbodorsal fascia was closed with 0 Vicryl sutures the subcutaneous tissues reapproximated 2-0 Vicryl inverted sutures and the skin edges were reapproximated with 3-0 Vicryl subcuticular stitch. The wound is dressed with Dermabond and an occlusive dressing. Patient was extubated in the operating room and taken to recovery in stable and satisfactory condition having tolerated his operation well counts were correct at the end of the case.   PLAN OF CARE: Admit to inpatient   PATIENT DISPOSITION:  PACU - hemodynamically stable.   Delay start of Pharmacological VTE agent (>24hrs) due to surgical blood loss or risk of bleeding: yes

## 2020-04-02 NOTE — H&P (Signed)
Patient ID:   [id number] Patient: Ashlee Leblanc  Date of Birth: July 30, 1950 Visit Type: Office Visit   Date: 03/24/2020 03:00 PM Provider: Marchia Meiers. Vertell Limber MD   This 68 year old female presents for back pain.  HISTORY OF PRESENT ILLNESS: 1.  back pain  The patient returns to review her lumbar imaging including MRI with contrast.  This shows what appears to be a sequestered fragment of herniated disc material on the right at L3-4 level.  The patient has severe right leg pain and weakness.  She grades her pain at 7 to 8/10.  She has 4-right quadriceps strength and decreased knee reflex on the right compared to the left with positive straight leg raise on right at 30.  While she has degenerative disease at other levels of the lumbar spine, the most significant finding is this new disc herniation L3-4 level on the right      Medical/Surgical/Interim History Reviewed, no change.  Last detailed document date:03/19/2020.     PAST MEDICAL HISTORY, SURGICAL HISTORY, FAMILY HISTORY, SOCIAL HISTORY AND REVIEW OF SYSTEMS I have reviewed the patient's past medical, surgical, family and social history as well as the comprehensive review of systems as included on the [facility] history form dated 03/19/2020, which I have signed.  Family History: Reviewed, no changes.  Last detailed document date:03/19/2020.   Social History: Reviewed, no changes. Last detailed document date: 03/19/2020.    MEDICATIONS: (added, continued or stopped this visit) Started Medication Directions Instruction Stopped  citalopram 20 mg tablet     enalapril maleate 10 mg tablet     glipizide 5 mg tablet     hydrochlorothiazide 12.5 mg tablet     hyoscyamine ER 0.375 mg tablet,extended release,12 hr     metformin 1,000 mg tablet     montelukast 10 mg tablet     pioglitazone 45 mg tablet     simvastatin 40 mg  tablet       ALLERGIES: Ingredient Reaction Medication Name Comment PROPOXYPHENE HCL  Darvon  TETRACYCLINE    MORPHINE    DIPHENHYDRAMINE HCL  Benadryl   Reviewed, no changes.    PHYSICAL EXAM:  Vitals Date Temp F BP Pulse Ht In Wt Lb BMI BSA Pain Score 03/24/2020  143/80 109 66 241.2 38.93  8/10     IMPRESSION:  Free fragment disc herniation L3-4 right with significant right leg weakness and pain.  PLAN: I have recommended proceeding with right L3-4 microdiskectomy.  The patient wishes to do so and we will perform surgery on an expedited basis given the severity of her pain and weakness.  Risks and benefits of surgery were discussed with the patient and she wishes to proceed.  Patient education was performed at length today.   Assessment/Plan  # Detail Type Description  1. Assessment Idiopathic scoliosis in adult patient (M41.20).     2. 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     3. Assessment HNP (herniated nucleus pulposus), lumbar (M51.26).     4. Assessment Lumbar radiculopathy (M54.16).                   Provider:  Marchia Meiers. Vertell Limber MD  03/25/2020 03:23 PM    Dictation edited by: Marchia Meiers. Vertell Limber    CC Providers: [facility] [address] [place] [address]-               Electronically signed  by Marchia Meiers Vertell Limber MD on 03/25/2020 03:23 PM Patient ID:   [id number] Patient: Ashlee Leblanc  Date of Birth: 10/09/1950 Visit Type: Office Visit   Date: 03/19/2020 11:00 AM Provider: Marchia Meiers. Vertell Limber MD   This 69 year old female presents for back pain.  HISTORY OF PRESENT ILLNESS: 1.  back pain  Ms. Guerin is a 69 year old female who was referred for neurosurgical evaluation by Dr. Lesly Dukes for evaluation of acute left-sided low back pain with left lower extremity radiculopathy.  Patient reports that  in the end of July 22 wound 1 she began having bilateral low back pain that radiated down her right buttock and into her right lower extremity.  The pain was stated to affect her right lateral thigh and anterior shin.  She also reported numbness along the anterior margin of her right shin.  She initially presented to her primary care provider who prescribed a dose of steroids.  She stated the steroids made no change in her symptoms.  She does report 2 falls, most recent 1 being about 1 week ago, where she reports that her right knee buckled.  She does have a history of bilateral knee replacement and is currently seeking physical therapy treatment for her knees.  She further reports that the pain radiating into her lower leg has progressively worsened.  However, her back and thigh pain are unchanged  Medications:  Advil 400 milligrams p.r.n., Tylenol 500 milligrams p.r.n. (both stated to help moderately)  Past surgical history:  L4-5 microdiskectomy in 1974, left knee total replacement 2013, right knee total replacement 2017       PAST MEDICAL/SURGICAL HISTORY:   (Detailed)   Disease/disorder Onset Date Management Date Comments   Knee replacement, total     rotator cuff surgery   Diabetes     Hypercholesterolemia     Hypertension        Family History:  (Detailed)   Social History:  (Detailed) Tobacco use reviewed. Preferred language is Unknown.   Tobacco use status: Ex-smoker. Smoking status: Former smoker.  SMOKING STATUS Type Smoking Status Usage Per Day Years Used Total Pack Years  Former smoker         MEDICATIONS: (added, continued or stopped this visit) Started Medication Directions Instruction Stopped  citalopram 20 mg tablet     enalapril maleate 10 mg tablet     glipizide 5 mg tablet     hydrochlorothiazide 12.5 mg tablet     hyoscyamine ER 0.375 mg tablet,extended release,12 hr     metformin 1,000 mg  tablet     montelukast 10 mg tablet     pioglitazone 45 mg tablet     simvastatin 40 mg tablet       ALLERGIES: Ingredient Reaction Medication Name Comment PROPOXYPHENE HCL  Darvon  TETRACYCLINE    MORPHINE    DIPHENHYDRAMINE HCL  Benadryl   Reviewed, updated.    PHYSICAL EXAM:  Vitals Date Temp F BP Pulse Ht In Wt Lb BMI BSA Pain Score 03/19/2020  137/76 112 66 248 40.03  5/10   PHYSICAL EXAM Details General Level of Distress: no acute distress Overall Appearance: normal    Cardiovascular Cardiac: regular rate and rhythm without murmur  Respiratory Lungs: clear to auscultation  Neurological Recent and Remote Memory: normal Attention Span and Concentration:   normal Language: normal Fund of Knowledge: normal  Right Left Sensation: normal normal Upper Extremity Coordination: normal normal  Lower Extremity Coordination: normal normal  Musculoskeletal Gait and Station: normal  Right Left Upper Extremity Muscle Strength: normal normal Lower Extremity Muscle Strength: normal normal Upper Extremity Muscle Tone:  normal normal Lower Extremity Muscle Tone: normal normal   Motor Strength Upper and lower extremity motor strength was tested in the clinically pertinent muscles. Any abnormal findings will be noted below.   Right Left Knee Extensor: 4-/5  Knee Flexor: 4-/5  EHL: 4+/5    Deep Tendon Reflexes  Right Left Biceps: normal normal Triceps: normal normal Brachioradialis: normal normal Patellar: normal normal Achilles: normal normal  Sensory Sensation was tested at L1 to S1. Any abnormal findings will be noted below.  Right Left L5: decreased    Cranial Nerves II. Optic Nerve/Visual Fields: normal III. Oculomotor: normal IV. Trochlear: normal V. Trigeminal: normal VI. Abducens: normal VII. Facial: normal VIII. Acoustic/Vestibular: normal IX. Glossopharyngeal: normal X. Vagus: normal XI.  Spinal Accessory: normal XII. Hypoglossal: normal  Motor and other Tests Lhermittes: negative Rhomberg: negative    Right Left Hoffman's: normal normal Clonus: normal normal Babinski: normal normal SLR: negative negative Patrick's Corky Sox): negative negative Toe Walk: normal normal Toe Lift: normal normal Heel Walk: normal normal SI Joint: nontender nontender     DIAGNOSTIC RESULTS:  Noncontrast lumbar MRI and four view lumbar radiographs reviewed with patient in the office today. She has a levoscoliosis centered at the L2-3 and L3-4 level.  L2-3 mild spinal stenosis. There is a herniated disc at the L3-4 level extending inferiorly leading to moderate spinal stenosis and severe right and mild left foraminal stenosis. L4-5 with severe spinal stenosis and severe left-sided foraminal stenosis that appears to be compressing the exit exiting nerve root.  There is also moderate right foraminal stenosis at this level.  L5-S1 severe left foraminal stenosis and mild right foraminal stenosis.     IMPRESSION:  The patient has been having significant bilateral low back pain with a right radiculopathy.  Appears that her radiculopathy has been worsening.  Her imaging is most remarkable for a level of scoliosis that appears to be putting excessive pressure on the L2-3 and L3-4 exiting nerve roots leading to severe foraminal stenosis.  There is an anterolisthesis of L4 on L5, neutral 4.2 millimeters, extension to 0.5 millimeters, flexion 4.5 millimeters.  On exam she has decreased sensation to pinprick in the right L5 distribution, right EHL 4+/5, right knee extension 4-/5, and right knee flexion 4-/5.  PLAN: The patient has a large caudally migrated herniated nucleus pulposus on the right at L3-4 causing significant nerve root compression.  She will need to undergo a right-sided microdiskectomy at the L3-4 level.  She will follow-up in the clinic to discuss and plan for  surgery.  Orders: Diagnostic Procedures: Assessment Procedure M54.16 Lumbar Spine- AP/Lat/Flex/Ex Instruction(s)/Education: Assessment Instruction I10 Lifestyle education Z68.41 Lifestyle education regarding diet  Completed Orders (this encounter) Order Details Reason Side Interpretation Result Initial Treatment Date Region Lifestyle education regarding diet Encouraged patient to eat well balanced diet.       Lifestyle education Patient will follow up with Primary care physician.       Lumbar Spine- AP/Lat/Flex/Ex      03/19/2020 All Levels to All Levels  Assessment/Plan  # Detail Type Description  1. 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     2. Assessment Other chronic pain (G89.29).     3. Assessment Idiopathic scoliosis in adult patient (M41.20).     4. 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     5. Assessment Lumbar spondylosis (M47.816).  6. 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     7. Assessment Lumbar radiculopathy (M54.16).     8. Assessment Essential (primary) hypertension (I10).     9. Assessment Body mass index (BMI) 40.0-44.9, adult (Z68.41).  Plan Orders Today's instructions / counseling include(s) Lifestyle education regarding diet. Clinical information/comments: Encouraged patient to eat well balanced diet.       Pain Management Plan Pain Scale: 5/10. Method: Numeric Pain Intensity Scale. Location: back. Onset: 12/18/2019. Duration: varies. Quality: discomforting. Pain management follow-up plan of care: Patient will continue medication management..              Provider:  Marchia Meiers. Vertell Limber MD  03/22/2020 09:23 AM    Dictation edited by: Fenton Malling, NP    CC Providers: [facility] [address] [place]  [address]-               Electronically signed by Fenton Malling NP on 03/22/2020 09:23 AM

## 2020-04-02 NOTE — Op Note (Signed)
04/02/2020  5:02 PM  PATIENT:  Ashlee Leblanc  69 y.o. 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 It was elected to take her to surgery for right L 34 laminectomy and microdiscectomy.    Procedure: Patient was brought to the operating room and following the smooth and uncomplicated induction of general endotracheal anesthesia she was placed in a prone position on the Wilson frame. Low back was prepped and draped in the usual sterile fashion with betadine scrub and DuraPrep. Preoperative localizing X ray confirmed correct level. Area of planned incision was infiltrated with local lidocaine. Incision was made in the midline and carried to the lumbodorsal fascia which was incised on the right side of midline. Subperiosteal dissection was performed exposing what was felt to be L 34 level. Intraoperative x-ray demonstrated marker probe at L 34.  A laminotomy of L 3 was performed as well as a foraminotomy overlying L 3. Ligamentum was mobilized and detached, exposing the common dural tube and  L 4 nerve root. The microscope was brought into the field and the L 4 nerve root was mobilized medially and carefully dissected from the large amount of degenerated ligament and synovial cyst  which was adherent to the dura.  Using microdissection, the material was removed and neural elements thoroughly decompressed. There was no violation of the dura.  A generous foraminotomy was performed along the course of the L 4 nerve root.  There did not appear to be a disc herniation as a cuse of the patient's radiculopathy.  Hemostasis was assured with bipolar electrocautery and the interspace was irrigated with Depo-Medrol and fentanyl. The lumbodorsal fascia was closed with 0 Vicryl sutures the subcutaneous tissues reapproximated 2-0 Vicryl inverted sutures and the skin edges were reapproximated with 3-0 Vicryl subcuticular stitch. The wound is dressed with Dermabond and an occlusive dressing. Patient was extubated in the operating room and taken to recovery in stable and satisfactory condition having tolerated his operation well counts were correct at the end of the case.   PLAN OF CARE: Admit to inpatient   PATIENT DISPOSITION:  PACU - hemodynamically stable.   Delay start of Pharmacological VTE agent (>24hrs) due to surgical blood loss or risk of bleeding: yes

## 2020-04-02 NOTE — Anesthesia Procedure Notes (Addendum)
Procedure Name: Intubation Date/Time: 04/02/2020 2:49 PM Performed by: Griffin Dakin, CRNA Pre-anesthesia Checklist: Patient identified, Emergency Drugs available, Suction available and Patient being monitored Patient Re-evaluated:Patient Re-evaluated prior to induction Oxygen Delivery Method: Circle system utilized Preoxygenation: Pre-oxygenation with 100% oxygen Induction Type: IV induction Ventilation: Mask ventilation without difficulty Laryngoscope Size: Mac and 4 Grade View: Grade II Tube type: Oral Tube size: 7.0 mm Number of attempts: 1 Airway Equipment and Method: Stylet Placement Confirmation: ETT inserted through vocal cords under direct vision,  positive ETCO2 and breath sounds checked- equal and bilateral Secured at: 22 cm Tube secured with: Tape Dental Injury: Teeth and Oropharynx as per pre-operative assessment

## 2020-04-02 NOTE — Progress Notes (Signed)
   04/02/20 1830  Clinical Encounter Type  Visited With Patient  Visit Type Initial  Referral From Family  Consult/Referral To Chaplain  Chaplain met the patient's niece, Coralyn Mark, as she was leaving. Terry asked Chaplain to check in with the patient to offer spiritual support. Patient stated she was doing well after her surgery and looking forward to going home.  Chaplain offered prayer. This note was prepared by Jeanine Luz, M.Div..  For questions please contact by phone [phone].

## 2020-04-02 NOTE — Interval H&P Note (Signed)
History and Physical Interval Note:  04/02/2020 2:11 PM  Ashlee Leblanc  has presented today for surgery, with the diagnosis of Lumbar herniated nucleus pulposus.  The various methods of treatment have been discussed with the patient and family. After consideration of risks, benefits and other options for treatment, the patient has consented to  Procedure(s) with comments: Right Lumbar Three-Four Microdiscectomy (Right) - posterior as a surgical intervention.  The patient's history has been reviewed, patient examined, no change in status, stable for surgery.  I have reviewed the patient's chart and labs.  Questions were answered to the patient's satisfaction.     [name]

## 2020-04-03 DIAGNOSIS — M5416 Radiculopathy, lumbar region: Secondary | ICD-10-CM | POA: Diagnosis not present

## 2020-04-03 LAB — GLUCOSE, CAPILLARY: Glucose-Capillary: 263 mg/dL — ABNORMAL HIGH (ref 70–99)

## 2020-04-03 MED ORDER — HYDROCODONE-ACETAMINOPHEN 5-325 MG PO TABS: 1.0000 | ORAL_TABLET | ORAL | 0 refills | Status: DC | PRN

## 2020-04-03 MED ORDER — METHOCARBAMOL 500 MG PO TABS: 500.0000 mg | ORAL_TABLET | Freq: Four times a day (QID) | ORAL | 0 refills | Status: DC | PRN

## 2020-04-03 NOTE — Care Management Obs Status (Signed)
[place] NOTIFICATION   Patient Details  Name: Ashlee Leblanc MRN: [id number] Date of Birth: 04-26-1951   Medicare Observation Status Notification Given:  Yes    Claudie Leach, RN 04/03/2020, 9:21 AM

## 2020-04-03 NOTE — Evaluation (Signed)
Physical Therapy Evaluation Patient Details Name: Ashlee Leblanc MRN: [id number] DOB: [birth year] Today's Date: 04/03/2020   History of Present Illness  69 yo s/p L3-4 microdiscectomy. PMH: HTN, B TKR; B RTC repair; previous bac surgery, DM.  Clinical Impression  Pt presents to PT without significant functional deficits compared to baseline. Pt does report 3/10 pain in R hip and shows some antalgic gait on RLE, however the pt is able to ambulate for household and limited community distances with use of cane independently. Pt requires supervision for stair negotiation at this time but has the assistance of her niece at home to provide this level of assistance. Pt demonstrates good knowledge of back precautions and log roll technique during session. Pt has no further acute PT needs at this time. PT encourages ambulation multiple times a day for the remainder of this hospitalization. Pt has no immediate PT follow-up needs at this time.    Follow Up Recommendations No PT follow up;Supervision - Intermittent    Equipment Recommendations  None recommended by PT    Recommendations for Other Services       Precautions / Restrictions Precautions Precautions: Back Precaution Booklet Issued: No Precaution Comments: precautions sheet already present in room, PT verbally reviews Restrictions Weight Bearing Restrictions: No      Mobility  Bed Mobility Overal bed mobility: Modified Independent             General bed mobility comments: pt received and left sitting edge of bed, pt verbalizes log roll technique without PT cues and feels confident in performing this technique. Pt declines bed mobility assessment at this time    Transfers Overall transfer level: Modified independent Equipment used: Straight cane Transfers: Sit to/from Stand Sit to Stand: Modified independent (Device/Increase time)            Ambulation/Gait Ambulation/Gait assistance: Modified independent  (Device/Increase time) Gait Distance (Feet): 140 Feet Assistive device: Straight cane Gait Pattern/deviations: Step-to pattern;Decreased step length - left;Decreased stance time - right Gait velocity: reduced Gait velocity interpretation: <1.8 ft/sec, indicate of risk for recurrent falls General Gait Details: pt with slight reduction in stance time on RLE and reduced L step length  Stairs Stairs: Yes Stairs assistance: Supervision Stair Management: One rail Right;Step to pattern Number of Stairs: 1    Wheelchair Mobility    Modified Rankin (Stroke Patients Only)       Balance Overall balance assessment: Needs assistance Sitting-balance support: No upper extremity supported;Feet supported Sitting balance-Leahy Scale: Good     Standing balance support: Single extremity supported Standing balance-Leahy Scale: Poor Standing balance comment: reliant on UE support of cane, supervision level                             Pertinent Vitals/Pain Pain Assessment: 0-10 Pain Score: 3  Pain Location: back Pain Descriptors / Indicators: Sore Pain Intervention(s): Monitored during session    Home Living Family/patient expects to be discharged to:: Private residence Living Arrangements: Alone Available Help at Discharge: Family;Available 24 hours/day (niece initially for 24/7) Type of Home: House Home Access: Stairs to enter Entrance Stairs-Rails: None Entrance Stairs-Number of Steps: 1 Home Layout: One level Home Equipment: Walker - 2 wheels;Bedside commode;Shower seat;Cane - single point      Prior Function Level of Independence: Independent         Comments: drives     Hand Dominance   Dominant Hand: Right    Extremity/Trunk Assessment  Upper Extremity Assessment Upper Extremity Assessment: Overall WFL for tasks assessed    Lower Extremity Assessment Lower Extremity Assessment: RLE deficits/detail RLE Deficits / Details: RLE grossly 4+/5     Cervical / Trunk Assessment Cervical / Trunk Assessment: Other exceptions (back surgery)  Communication   Communication: HOH  Cognition Arousal/Alertness: Awake/alert Behavior During Therapy: WFL for tasks assessed/performed Overall Cognitive Status: Within Functional Limits for tasks assessed                                        General Comments General comments (skin integrity, edema, etc.): VSS on RA    Exercises     Assessment/Plan    PT Assessment Patent does not need any further PT services  PT Problem List         PT Treatment Interventions      PT Goals (Current goals can be found in the Care Plan section)  Acute Rehab PT Goals Patient Stated Goal: to go home    Frequency     Barriers to discharge        Co-evaluation               AM-PAC PT "6 Clicks" Mobility  Outcome Measure Help needed turning from your back to your side while in a flat bed without using bedrails?: None Help needed moving from lying on your back to sitting on the side of a flat bed without using bedrails?: None Help needed moving to and from a bed to a chair (including a wheelchair)?: None Help needed standing up from a chair using your arms (e.g., wheelchair or bedside chair)?: None Help needed to walk in hospital room?: None Help needed climbing 3-5 steps with a railing? : None 6 Click Score: 24    End of Session   Activity Tolerance: Patient tolerated treatment well Patient left: in bed;with call bell/phone within reach Nurse Communication: Mobility status      Time: 3295-1884 PT Time Calculation (min) (ACUTE ONLY): 10 min   Charges:   PT Evaluation $PT Eval Low Complexity: Finger, PT, DPT Acute Rehabilitation Pager: [phone]   Zenaida Niece 04/03/2020, 9:06 AM

## 2020-04-03 NOTE — Discharge Summary (Signed)
Discharge Summary  Date of Admission: 04/02/2020  Date of Discharge: 04/03/20  Attending Physician: Erline Levine, MD  Hospital Course: Patient was admitted following an uncomplicated O5-0 microdiscectomy. She was recovered in PACU and transferred to [facility]. Her hospital course was uncomplicated and the patient was discharged home on POD1. She will follow up in clinic with Dr. Vertell Limber in 2 weeks.  Neurologic exam at discharge:  AOx3, PERRL, EOMI, FS, Strength 5/5 x4 except 4/5 in the right knee flexors, SILTx4 except some R L4 / L5 distribution numbness Incision c/d/i  Discharge diagnosis: Lumbar radiculopathy  Ashlee Part, MD 04/03/20 8:34 AM

## 2020-04-03 NOTE — Progress Notes (Signed)
Occupational Therapy Evaluation Patient Details Name: Ashlee Leblanc MRN: [id number] DOB: 1950/07/02 Today's Date: 04/03/2020    History of Present Illness 69 yo s/p L3-4 microdiscectomy. PMH: HTN, B TKR; B RTC repair; previous bac surgery, DM.   Clinical Impression   PTA, pt lives alone @ independent level. Reviewed compensatory strategies and use of AE/ DME to maximize functional level of independence, increase adherence to back precautions and reduce risk of falls. Pt ablet o return demonstrate understanding of information during ADL session. Written handout reviewed. Pt will have assistance at DC. Pt appreciative. No further OT needed.     Follow Up Recommendations  No OT follow up;Supervision - Intermittent    Equipment Recommendations  None recommended by OT    Recommendations for Other Services       Precautions / Restrictions Precautions Precautions: Back      Mobility Bed Mobility Overal bed mobility: Modified Independent             General bed mobility comments: Reviewed correct technique    Transfers Overall transfer level: Modified independent                    Balance Overall balance assessment: Mild deficits observed, not formally tested                                         ADL either performed or assessed with clinical judgement   ADL Overall ADL's : Needs assistance/impaired                                     Functional mobility during ADLs: Modified independent General ADL Comments: Educated on compensatory technqieus and use of AE and DEM for ADL. Pt able to return demonstrate with use of toilet tong; reacher, sock aid and reacher. Educated onstrategies for grooming. Educated on home safety and set up to increase aderence to precautions.     Vision         Perception     Praxis      Pertinent Vitals/Pain Pain Assessment: 0-10 Pain Score: 3  Pain Location: back Pain Descriptors /  Indicators: Operative site guarding Pain Intervention(s): Limited activity within patient's tolerance     Hand Dominance Right   Extremity/Trunk Assessment Upper Extremity Assessment Upper Extremity Assessment: Overall WFL for tasks assessed (hx of B shoulder surgery - functional ROM)   Lower Extremity Assessment Lower Extremity Assessment: Defer to PT evaluation   Cervical / Trunk Assessment Cervical / Trunk Assessment: Other exceptions (back surgery)   Communication Communication Communication: HOH   Cognition Arousal/Alertness: Awake/alert Behavior During Therapy: WFL for tasks assessed/performed Overall Cognitive Status: Within Functional Limits for tasks assessed                                     General Comments       Exercises     Shoulder Instructions      Home Living Family/patient expects to be discharged to:: Private residence Living Arrangements: Alone Available Help at Discharge: Family;Available 24 hours/day (initially - niece) Type of Home: House Home Access: Stairs to enter [employer] of Steps: 1   Home Layout: One level     Bathroom Shower/Tub: Tub/shower unit;Walk-in  shower   Bathroom Toilet: Standard Bathroom Accessibility: Yes How Accessible: Accessible via walker Home Equipment: [place] - 2 wheels;Cane - single point;Bedside commode;Shower seat          Prior Functioning/Environment Level of Independence: Independent        Comments: drives        OT Problem List: Decreased knowledge of use of DME or AE;Decreased knowledge of precautions;Obesity;Pain      OT Treatment/Interventions:      OT Goals(Current goals can be found in the care plan section) Acute Rehab OT Goals Patient Stated Goal: to go home OT Goal Formulation: All assessment and education complete, DC therapy  OT Frequency:     Barriers to D/C:            Co-evaluation              AM-PAC OT "6 Clicks" Daily Activity      Outcome Measure Help from another person eating meals?: None Help from another person taking care of personal grooming?: A Little Help from another person toileting, which includes using toliet, bedpan, or urinal?: A Little Help from another person bathing (including washing, rinsing, drying)?: A Little Help from another person to put on and taking off regular upper body clothing?: A Little Help from another person to put on and taking off regular lower body clothing?: A Little 6 Click Score: 19   End of Session Equipment Utilized During Treatment: Other (comment) (straight cane) Nurse Communication: Mobility status;Other (comment) (DC needs)  Activity Tolerance: Patient tolerated treatment well Patient left: in bed;with call bell/phone within reach  OT Visit Diagnosis: Unsteadiness on feet (R26.81);Pain Pain - part of body:  (back)                Time: 5329-9242 OT Time Calculation (min): 25 min Charges:  OT General Charges $OT Visit: 1 Visit OT Evaluation $OT Eval Low Complexity: 1 Low OT Treatments $Self Care/Home Management : 8-22 mins  Maurie Boettcher, OT/L   Acute OT Clinical Specialist Acute Rehabilitation Services Pager [phone] Office [phone]   [facility] 04/03/2020, 8:47 AM

## 2020-04-03 NOTE — Progress Notes (Signed)
Neurosurgery Service Progress Note  Subjective: No acute events overnight, radicular pain improved, numbness stable, strength stable   Objective: Vitals:   04/02/20 2001 04/02/20 2311 04/03/20 0404 04/03/20 0726  BP: (!) 141/70 (!) 129/57 133/61 (!) 127/49  Pulse: 91 84 64 84  Resp: 18 18 18 16   Temp: 98.1 F (36.7 C) 97.9 F (36.6 C) 98.1 F (36.7 C) 98.6 F (37 C)  TempSrc: Oral Oral Oral Oral  SpO2: 95% 93% 97% 97%  Weight:      Height:       Temp (24hrs), Avg:98 F (36.7 C), Min:97.4 F (36.3 C), Max:98.6 F (37 C)  CBC Latest Ref Rng & Units 03/30/2020  WBC 4.0 - 10.5 K/uL 7.1  Hemoglobin 12.0 - 15.0 g/dL 13.8  Hematocrit 36 - 46 % 43.2  Platelets 150 - 400 K/uL 216   BMP Latest Ref Rng & Units 03/30/2020  Glucose 70 - 99 mg/dL 148(H)  BUN 8 - 23 mg/dL 19  Creatinine 0.44 - 1.00 mg/dL 0.80  Sodium 135 - 145 mmol/L 140  Potassium 3.5 - 5.1 mmol/L 3.9  Chloride 98 - 111 mmol/L 99  CO2 22 - 32 mmol/L 28  Calcium 8.9 - 10.3 mg/dL 9.9    Intake/Output Summary (Last 24 hours) at 04/03/2020 0830 Last data filed at 04/03/2020 8250 Gross per 24 hour  Intake 910 ml  Output 50 ml  Net 860 ml    Current Facility-Administered Medications:  .  0.9 %  sodium chloride infusion, 250 mL, Intravenous, Continuous, Erline Levine, MD .  acetaminophen (TYLENOL) tablet 650 mg, 650 mg, Oral, Q4H PRN **OR** acetaminophen (TYLENOL) suppository 650 mg, 650 mg, Rectal, Q4H PRN, Erline Levine, MD .  alum & mag hydroxide-simeth (MAALOX/MYLANTA) 200-200-20 MG/5ML suspension 30 mL, 30 mL, Oral, Q6H PRN, Erline Levine, MD .  ascorbic acid (VITAMIN C) tablet 500 mg, 500 mg, Oral, Daily, Erline Levine, MD .  bisacodyl (DULCOLAX) suppository 10 mg, 10 mg, Rectal, Daily PRN, Erline Levine, MD .  calcium-vitamin D (OSCAL WITH D) 500-200 MG-UNIT per tablet 1 tablet, 1 tablet, Oral, Q breakfast, Erline Levine, MD .  citalopram (CELEXA) tablet 20 mg, 20 mg, Oral, Daily, Erline Levine, MD .   dextrose 5 % and 0.45 % NaCl with KCl 20 mEq/L infusion, , Intravenous, Continuous, Erline Levine, MD .  docusate sodium (COLACE) capsule 100 mg, 100 mg, Oral, BID, Erline Levine, MD .  enalapril (VASOTEC) tablet 10 mg, 10 mg, Oral, Daily, Erline Levine, MD .  glipiZIDE (GLUCOTROL XL) 24 hr tablet 5 mg, 5 mg, Oral, BID WC, Erline Levine, MD .  hydrochlorothiazide (MICROZIDE) capsule 12.5 mg, 12.5 mg, Oral, Daily, Erline Levine, MD .  HYDROcodone-acetaminophen (NORCO/VICODIN) 5-325 MG per tablet 1-2 tablet, 1-2 tablet, Oral, Q4H PRN, Erline Levine, MD, 2 tablet at 04/03/20 0726 .  HYDROmorphone (DILAUDID) injection 0.5 mg, 0.5 mg, Intravenous, Q2H PRN, Erline Levine, MD .  hyoscyamine (LEVBID) 0.375 MG 12 hr tablet 0.375 mg, 0.375 mg, Oral, Q12H PRN, Erline Levine, MD .  insulin aspart (novoLOG) injection 0-15 Units, 0-15 Units, Subcutaneous, TID WC, Erline Levine, MD .  insulin aspart (novoLOG) injection 0-15 Units, 0-15 Units, Subcutaneous, TID WC, Erline Levine, MD, 8 Units at 04/03/20 [phone] .  insulin aspart (novoLOG) injection 0-5 Units, 0-5 Units, Subcutaneous, QHS, Erline Levine, MD, 5 Units at 04/02/20 2139 .  menthol-cetylpyridinium (CEPACOL) lozenge 3 mg, 1 lozenge, Oral, PRN **OR** phenol (CHLORASEPTIC) mouth spray 1 spray, 1 spray, Mouth/Throat, PRN, Erline Levine, MD .  metFORMIN (GLUCOPHAGE) tablet 1,000 mg, 1,000 mg, Oral, BID WC, Erline Levine, MD .  methocarbamol (ROBAXIN) tablet 500 mg, 500 mg, Oral, Q6H PRN **OR** methocarbamol (ROBAXIN) 500 mg in dextrose 5 % 50 mL IVPB, 500 mg, Intravenous, Q6H PRN, Erline Levine, MD .  montelukast (SINGULAIR) tablet 10 mg, 10 mg, Oral, QHS, Erline Levine, MD, 10 mg at 04/02/20 2117 .  multivitamin with minerals tablet 1 tablet, 1 tablet, Oral, Daily, Erline Levine, MD .  nystatin-triamcinolone (MYCOLOG II) cream 1 application, 1 application, Topical, TID PRN, Erline Levine, MD .  ondansetron [facility]) tablet 4 mg, 4 mg, Oral, Q6H PRN **OR**  ondansetron (ZOFRAN) injection 4 mg, 4 mg, Intravenous, Q6H PRN, Erline Levine, MD .  oxyCODONE (Oxy IR/ROXICODONE) immediate release tablet 5-10 mg, 5-10 mg, Oral, Q3H PRN, Erline Levine, MD .  pantoprazole (PROTONIX) EC tablet 40 mg, 40 mg, Oral, Daily, Erline Levine, MD .  pioglitazone (ACTOS) tablet 45 mg, 45 mg, Oral, Daily, Erline Levine, MD .  polyethylene glycol (MIRALAX / GLYCOLAX) packet 17 g, 17 g, Oral, Daily PRN, Erline Levine, MD .  simvastatin (ZOCOR) tablet 40 mg, 40 mg, Oral, QPM, Erline Levine, MD, 40 mg at 04/02/20 1900 .  sodium chloride flush (NS) 0.9 % injection 3 mL, 3 mL, Intravenous, Q12H, Erline Levine, MD, 3 mL at 04/02/20 2117 .  sodium chloride flush (NS) 0.9 % injection 3 mL, 3 mL, Intravenous, PRN, Erline Levine, MD .  sodium phosphate (FLEET) 7-19 GM/118ML enema 1 enema, 1 enema, Rectal, Once PRN, Erline Levine, MD .  vitamin E capsule 400 Units, 400 Units, Oral, Daily, Erline Levine, MD .  zolpidem [facility]) tablet 5 mg, 5 mg, Oral, QHS PRN, Erline Levine, MD   Physical Exam: AOx3, PERRL, EOMI, FS, Strength 5/5 x4 except 4/5 in the right knee flexors, SILTx4 except some R L4 / L5 distribution numbness Incision c/d/i  Assessment & Plan: 69 y.o. woman s/p L3-4 microdisc, recovering well.  -discharge home this morning  Marcello Moores A Imonie Tuch  04/03/20 8:30 AM

## 2020-04-03 NOTE — Progress Notes (Signed)
Patient is discharged from room 3C05 at this time. Alert and in stable condition. IV site d/c'd and instructions read to patient and niece with understanding verbalized and all questions answered. Left unit via wheelchair with all belongings at side.

## 2020-04-03 NOTE — Care Management CC44 (Signed)
Condition Code 44 Documentation Completed  Patient Details  Name: Ashlee Leblanc MRN: [id number] Date of Birth: [birth year]   Condition Code 44 given:  Yes Patient signature on Condition Code 44 notice:  Yes Documentation of 2 MD's agreement:  Yes Code 44 added to claim:  Yes    Claudie Leach, RN 04/03/2020, 9:21 AM

## 2020-04-04 NOTE — Anesthesia Postprocedure Evaluation (Signed)
Anesthesia Post Note  Patient: Ashlee Leblanc  Procedure(s) Performed: Right Lumbar Three-Four Microdiscectomy (Right Spine Lumbar)     Patient location during evaluation: PACU Anesthesia Type: General Level of consciousness: awake and alert Pain management: pain level controlled Vital Signs Assessment: post-procedure vital signs reviewed and stable Respiratory status: spontaneous breathing, nonlabored ventilation, respiratory function stable and patient connected to nasal cannula oxygen Cardiovascular status: blood pressure returned to baseline and stable Postop Assessment: no apparent nausea or vomiting Anesthetic complications: no   No complications documented.  Last Vitals:  Vitals:   04/03/20 0404 04/03/20 0726  BP: 133/61 (!) 127/49  Pulse: 64 84  Resp: 18 16  Temp: 36.7 C 37 C  SpO2: 97% 97%    Last Pain:  Vitals:   04/03/20 0826  TempSrc:   PainSc: 2                  [name] DAVID

## 2020-04-05 ENCOUNTER — Encounter (HOSPITAL_COMMUNITY): Payer: Self-pay | Admitting: Neurosurgery

## 2020-04-12 NOTE — Addendum Note (Signed)
Addendum  created 04/12/20 1656 by Myrtie Soman, MD   Intraprocedure Staff edited

## 2020-04-21 ENCOUNTER — Encounter (HOSPITAL_COMMUNITY): Payer: Self-pay

## 2020-04-21 ENCOUNTER — Ambulatory Visit (HOSPITAL_COMMUNITY)
Admission: EM | Admit: 2020-04-21 | Discharge: 2020-04-21 | Disposition: A | Discharge status: Home / Self Care | Payer: Medicare Other | Attending: Family Medicine | Admitting: Family Medicine

## 2020-04-21 ENCOUNTER — Other Ambulatory Visit: Payer: Self-pay

## 2020-04-21 DIAGNOSIS — M79604 Pain in right leg: Secondary | ICD-10-CM | POA: Diagnosis not present

## 2020-04-21 DIAGNOSIS — R252 Cramp and spasm: Secondary | ICD-10-CM

## 2020-04-21 MED ORDER — TRIAMCINOLONE ACETONIDE 40 MG/ML IJ SUSP
40.0000 mg | Freq: Once | INTRAMUSCULAR | Status: AC
  Administered 2020-04-21: 40 mg via INTRAMUSCULAR

## 2020-04-21 MED ORDER — TRIAMCINOLONE ACETONIDE 40 MG/ML IJ SUSP
INTRAMUSCULAR | Status: AC
  Filled 2020-04-21: qty 1

## 2020-04-21 NOTE — ED Triage Notes (Signed)
Pt in with c/o right hip pain that radiates down to knee and thigh. Pt recently had back surgery on 04/02/20.  Pt states that she took muscle relaxer with no relief

## 2020-04-25 NOTE — ED Provider Notes (Signed)
[place]    CSN: [id number] Arrival date & time: 04/21/20  1444      History   Chief Complaint Chief Complaint  Patient presents with  . Leg Pain  . Hip Pain    HPI Ashlee Leblanc is a 69 y.o. female.   Pt c/o right hip pain that radiates down leg anteriorly toward the knee the past few days, worse with movement and weight bearing. Recent lumbar spine surgery 04/02/20 and has had the expected leg muscle cramping since but this has become intolerable per patient at this point. Denies fever, swelling, redness, point tenderness, CP, SOB, palpitations. Has been taking muscle relaxers, pain relievers without much relief and using warm compresses. No known hx of coagulopathy. She states she spoke with her surgeon about this and was given steroid taper to start tomorrow and told she should come here to get a steroid injection to get the medicine started tonight.      Past Medical History:  Diagnosis Date  . Arthritis   . 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  . Claustrophobia   . Complication of anesthesia    " hard time Knocking me out." "I wake up before I leave the OR."  . Diabetes ([place])    Type II  . GERD (gastroesophageal reflux disease)   . History of hiatal hernia   . HLD (hyperlipidemia)   . HTN (hypertension)   . Sleep apnea 2001   can't wear mask - claystrophbic    Patient Active Problem List   Diagnosis Date Noted  . Herniated lumbar disc without myelopathy 04/02/2020  . Irritable bowel syndrome (IBS) 11/04/2019    Past Surgical History:  Procedure Laterality Date  . BACK SURGERY    . BIOPSY  09/04/2019   Procedure: BIOPSY;  Surgeon: Rogene Houston, MD;  [facility];  Service: Endoscopy;;  random colon  . CHOLECYSTECTOMY  2015  . COLONOSCOPY N/A 09/04/2019   Procedure: COLONOSCOPY;  Surgeon: Rogene Houston, MD;  [facility];  Service: Endoscopy;  Laterality: N/A;  1250  . LUMBAR LAMINECTOMY/DECOMPRESSION  MICRODISCECTOMY Right 04/02/2020   Procedure: Right Lumbar Three-Four Microdiscectomy;  Surgeon: Erline Levine, MD;  Location: [facility];  Service: Neurosurgery;  Laterality: Right;  posterior  . Neuromoa Left    foot  . PLANTAR FASCIA SURGERY Bilateral   . REPLACEMENT TOTAL KNEE BILATERAL    . ROTATOR CUFF REPAIR     bilateral at different dates    OB History   No obstetric history on file.      Home Medications    Prior to Admission medications   Medication Sig Start Date End Date Taking? Authorizing Provider  Ascorbic Acid (VITAMIN C WITH ROSE HIPS) 500 MG tablet Take 500 mg by mouth daily.    [provider]  Calcium Carbonate-Vitamin D (CALCIUM 600+D PO) Take 1 tablet by mouth daily.     [provider]  Cholecalciferol (VITAMIN D-3) 125 MCG (5000 UT) TABS Take 5,000 Units by mouth at bedtime.     [provider]  citalopram (CELEXA) 20 MG tablet Take 20 mg by mouth daily.    [provider]  enalapril (VASOTEC) 10 MG tablet Take 10 mg by mouth daily.    [provider]  glipiZIDE (GLUCOTROL XL) 5 MG 24 hr tablet Take 5 mg by mouth 2 (two) times daily. 07/14/19   [provider]  Glucosamine-Chondroitin (MOVE FREE PO) Take 1  tablet by mouth daily. Move Freely    [provider]  hydrochlorothiazide (MICROZIDE) 12.5 MG capsule Take 12.5 mg by mouth daily. 07/31/19   [provider]  HYDROcodone-acetaminophen (NORCO/VICODIN) 5-325 MG tablet Take 1 tablet by mouth every 4 (four) hours as needed (pain). 04/03/20   Judith Part, MD  hyoscyamine (LEVBID) 0.375 MG 12 hr tablet TAKE 1 TABLET (0.375 MG TOTAL) BY MOUTH DAILY BEFORE BREAKFAST. (NOT COVERED. DR DID NOT CHANGE) 01/29/20   Laurine Blazer A, PA-C  MEGARED OMEGA-3 KRILL OIL PO Take 1 capsule by mouth daily.     [provider]  metFORMIN (GLUCOPHAGE) 1000 MG tablet Take 1,000 mg by mouth 2 (two) times daily with a meal.    [provider]    methocarbamol (ROBAXIN) 500 MG tablet Take 1 tablet (500 mg total) by mouth every 6 (six) hours as needed for muscle spasms. 04/03/20   Judith Part, MD  mometasone (NASONEX) 50 MCG/ACT nasal spray Place 2 sprays into the nose daily as needed (allergies.).  07/14/19   [provider]  montelukast (SINGULAIR) 10 MG tablet Take 10 mg by mouth at bedtime.    [provider]  Multiple Vitamin (MULTIVITAMIN WITH MINERALS) TABS tablet Take 1 tablet by mouth daily.    [provider]  nystatin-triamcinolone (MYCOLOG II) cream Apply 1 application topically 3 (three) times daily as needed.    [provider]  omeprazole (PRILOSEC) 20 MG capsule Take 20 mg by mouth every other day.     [provider]  pioglitazone (ACTOS) 45 MG tablet Take 45 mg by mouth daily.    [provider]  Probiotic Product (PROBIOTIC PO) Take 1 capsule by mouth daily.     [provider]  simvastatin (ZOCOR) 40 MG tablet Take 40 mg by mouth every evening.    [provider]  Specialty Vitamins Products (BRAIN PO) Take 1 tablet by mouth in the morning and at bedtime. Cognium Brain Health Tablets    [provider]  vitamin E (VITAMIN E) 180 MG (400 UNITS) capsule Take 400 Units by mouth daily.    [provider]    Family History Family History  Problem Relation Age of Onset  . Colon cancer Brother     Social History Social History   Tobacco Use  . Smoking status: Former Smoker    Years: 1.00  . Smokeless tobacco: Never Used  . Tobacco comment: 03/30/20- over 35 years ago  Vaping Use  . Vaping Use: Never used  Substance Use Topics  . Alcohol use: Never  . Drug use: Never     Allergies   Benadryl [diphenhydramine], Darvon [propoxyphene], Morphine and related, and Tetracyclines & related   Review of Systems Review of Systems PER HPI    Physical Exam Triage Vital Signs ED Triage Vitals  Enc Vitals Group     BP  04/21/20 1646 (!) 154/58     Pulse Rate 04/21/20 1646 (!) 105     Resp 04/21/20 1646 19     Temp --      Temp src --      SpO2 04/21/20 1646 98 %     Weight --      Height --      Head Circumference --      Peak Flow --      Pain Score 04/21/20 1644 10     Pain Loc --      Pain Edu? --  Excl. in GC? --    No data found.  Updated Vital Signs BP (!) 154/58 (BP Location: Right Arm)   Pulse (!) 105   Resp 19   SpO2 98%   Visual Acuity Right Eye Distance:   Left Eye Distance:   Bilateral Distance:    Right Eye Near:   Left Eye Near:    Bilateral Near:     Physical Exam Vitals and nursing note reviewed.  Constitutional:      Appearance: Normal appearance. She is not ill-appearing.  HENT:     Head: Atraumatic.  Eyes:     Extraocular Movements: Extraocular movements intact.     Conjunctiva/sclera: Conjunctivae normal.  Cardiovascular:     Rate and Rhythm: Normal rate and regular rhythm.     Heart sounds: Normal heart sounds.  Pulmonary:     Effort: Pulmonary effort is normal. No respiratory distress.     Breath sounds: Normal breath sounds. No wheezing or rales.  Musculoskeletal:        General: Tenderness (ttp from right hip down anterior thigh diffusely) present. No swelling (no edema b/l LEs) or deformity. Normal range of motion.     Cervical back: Normal range of motion and neck supple.     Comments: Neg homans sign, squeeze test. No localized point tenderness  Skin:    General: Skin is warm and dry.     Findings: No erythema.     Comments: Surgical site healing well, no evidence in this area of infection  Neurological:     Mental Status: She is alert and oriented to person, place, and time.  Psychiatric:        Mood and Affect: Mood normal.        Thought Content: Thought content normal.        Judgment: Judgment normal.      UC Treatments / Results  Labs (all labs ordered are listed, but only abnormal results are displayed) Labs Reviewed - No data  to display  EKG   Radiology No results found.  Procedures Procedures (including critical care time)  Medications Ordered in UC Medications  triamcinolone acetonide (KENALOG-40) injection 40 mg (40 mg Intramuscular Given 04/21/20 1715)    Initial Impression / Assessment and Plan / UC Course  I have reviewed the triage vital signs and the nursing notes.  Pertinent labs & imaging results that were available during my care of the patient were reviewed by me and considered in my medical decision making (see chart for details).     Low suspicion for DVT causing sxs, more consistent with severe muscle spasms. Pt declines DVT r/o u/s today and mainly wanting steroid injection and to start her steroid pack, home muscle relaxers. Kenalog given IM today, continue home regimen per surgeon's direction and strict return precautions reviewed for worsening sxs.   Final Clinical Impressions(s) / UC Diagnoses   Final diagnoses:  Right leg pain  Muscle cramping   Discharge Instructions   None    ED Prescriptions    None     PDMP not reviewed this encounter.   Volney American, [place] 04/25/20 [phone]

## 2020-05-13 ENCOUNTER — Other Ambulatory Visit: Payer: Self-pay | Admitting: Neurosurgery

## 2020-05-24 ENCOUNTER — Other Ambulatory Visit (HOSPITAL_COMMUNITY)
Admission: RE | Admit: 2020-05-24 | Discharge: 2020-05-24 | Disposition: A | Discharge status: Home / Self Care | Payer: Medicare Other | Source: Ambulatory Visit | Attending: Neurosurgery | Admitting: Neurosurgery

## 2020-05-24 ENCOUNTER — Encounter (HOSPITAL_COMMUNITY): Payer: Self-pay

## 2020-05-24 ENCOUNTER — Encounter (HOSPITAL_COMMUNITY)
Admission: RE | Admit: 2020-05-24 | Discharge: 2020-05-24 | Disposition: A | Discharge status: Home / Self Care | Payer: Medicare Other | Source: Ambulatory Visit | Attending: Neurosurgery | Admitting: Neurosurgery

## 2020-05-24 ENCOUNTER — Other Ambulatory Visit: Payer: Self-pay

## 2020-05-24 DIAGNOSIS — Z01812 Encounter for preprocedural laboratory examination: Secondary | ICD-10-CM | POA: Insufficient documentation

## 2020-05-24 DIAGNOSIS — Z20822 Contact with and (suspected) exposure to covid-19: Secondary | ICD-10-CM | POA: Insufficient documentation

## 2020-05-24 LAB — CBC
HCT: 41.1 % (ref 36.0–46.0)
Hemoglobin: 13.8 g/dL (ref 12.0–15.0)
MCH: 31.9 pg (ref 26.0–34.0)
MCHC: 33.6 g/dL (ref 30.0–36.0)
MCV: 94.9 fL (ref 80.0–100.0)
Platelets: 265 10*3/uL (ref 150–400)
RBC: 4.33 MIL/uL (ref 3.87–5.11)
RDW: 11.7 % (ref 11.5–15.5)
WBC: 6.9 10*3/uL (ref 4.0–10.5)
nRBC: 0 % (ref 0.0–0.2)

## 2020-05-24 LAB — BASIC METABOLIC PANEL
Anion gap: 11 (ref 5–15)
BUN: 18 mg/dL (ref 8–23)
CO2: 25 mmol/L (ref 22–32)
Calcium: 9.2 mg/dL (ref 8.9–10.3)
Chloride: 102 mmol/L (ref 98–111)
Creatinine, Ser: 0.74 mg/dL (ref 0.44–1.00)
GFR, Estimated: >60 mL/min (ref >60)
Glucose, Bld: 164 mg/dL — ABNORMAL HIGH (ref 70–99)
Potassium: 4 mmol/L (ref 3.5–5.1)
Sodium: 138 mmol/L (ref 135–145)

## 2020-05-24 LAB — SURGICAL PCR SCREEN
MRSA, PCR: POSITIVE — AB
Staphylococcus aureus: POSITIVE — AB

## 2020-05-24 LAB — TYPE AND SCREEN
ABO/RH(D): A POS
Antibody Screen: NEGATIVE

## 2020-05-24 LAB — GLUCOSE, CAPILLARY: Glucose-Capillary: 193 mg/dL — ABNORMAL HIGH (ref 70–99)

## 2020-05-24 LAB — HEMOGLOBIN A1C
Hgb A1c MFr Bld: 8.1 % — ABNORMAL HIGH (ref 4.8–5.6)
Mean Plasma Glucose: 185.77 mg/dL

## 2020-05-24 NOTE — Progress Notes (Signed)
PCP - Ephriam Jenkins @ [facility] in [place] Cardiologist - na   Chest x-ray - na EKG -  03/30/20 Stress Test - na ECHO -  na Cardiac Cath -  na  Sleep Study - greater 5 yrs. CPAP - no,cannot tolerate it  Fasting Blood Sugar - 120-150 Checks Blood Sugar __1___ times a day  Blood Thinner Instructions:  na Aspirin Instructions: na    COVID TEST-  05/24/20   Anesthesia review:   Patient denies shortness of breath, fever, cough and chest pain at PAT appointment   All instructions explained to the patient, with a verbal understanding of the material. Patient agrees to go over the instructions while at home for a better understanding. Patient also instructed to self quarantine after being tested for COVID-19. The opportunity to ask questions was provided.

## 2020-05-24 NOTE — Pre-Procedure Instructions (Addendum)
Ashlee Leblanc  05/24/2020      [id number] [place], [place] - [address] [place] [address] Phone: [phone] Fax: [phone]    Your procedure is scheduled on Dec. 16  Report to [facility] Entrance A  at 5:30 A.M.  Call this number if you have problems the morning of surgery:  [phone]   Remember:  Do not eat or drink after midnight.      Take these medicines the morning of surgery with A SIP OF WATER :             Citalopram(celexa)            Hydrocodone if needed            Hyoscyamine (levbid)            Methocarbamol (robaxin)            nasonex if needed            Omeprazole (prilosec) if needed           7 days prior to surgery STOP taking any Aspirin (unless otherwise instructed by your surgeon), Aleve, Naproxen, Ibuprofen, Motrin, Advil, Goody's, BC's, all herbal medications, fish oil, and all vitamins.                                 How to Manage Your Diabetes Before and After Surgery  Why is it important to control my blood sugar before and after surgery? . Improving blood sugar levels before and after surgery helps healing and can limit problems. . A way of improving blood sugar control is eating a healthy diet by: o  Eating less sugar and carbohydrates o  Increasing activity/exercise o  Talking with your doctor about reaching your blood sugar goals . High blood sugars (greater than 180 mg/dL) can raise your risk of infections and slow your recovery, so you will need to focus on controlling your diabetes during the weeks before surgery. . Make sure that the doctor who takes care of your diabetes knows about your planned surgery including the date and location.  How do I manage my blood sugar before surgery? . Check your blood sugar at least 4 times a day, starting 2 days before surgery, to make sure that the level is not too high or low. o Check your blood sugar the morning of  your surgery when you wake up and every 2 hours until you get to the Short Stay unit. . If your blood sugar is less than 70 mg/dL, you will need to treat for low blood sugar: o Do not take insulin. o Treat a low blood sugar (less than 70 mg/dL) with  cup of clear juice (cranberry or apple), 4 glucose tablets, OR glucose gel. Recheck blood sugar in 15 minutes after treatment (to make sure it is greater than 70 mg/dL). If your blood sugar is not greater than 70 mg/dL on recheck, call [phone] o  for further instructions. . Report your blood sugar to the short stay nurse when you get to Short Stay.  . If you are admitted to the hospital after surgery: o Your blood sugar will be checked by the staff and you will probably be given insulin after surgery (instead of oral diabetes medicines) to make sure you have good blood sugar levels. o The  goal for blood sugar control after surgery is 80-180 mg/dL.      WHAT DO I DO ABOUT MY DIABETES MEDICATION?   Marland Kitchen Do not take oral diabetes medicines (pills) the morning of surgery.( metformin/glucophage, pioglitazone/actos, glipizide/glucotrol)    Other Instructions:   DO NOT TAKE THE EVENING DOSE OF GLIPIZIDE/GLUCTROL THE NIGHT BEFORE SURGERY.        Do not wear jewelry, make-up or nail polish.  Do not wear lotions, powders, or perfumes, or deodorant.  Do not shave 48 hours prior to surgery.  Men may shave face and neck.  Do not bring valuables to the hospital.  [facility] is not responsible for any belongings or valuables.  Contacts, dentures or bridgework may not be worn into surgery.  Leave your suitcase in the car.  After surgery it may be brought to your room.  For patients admitted to the hospital, discharge time will be determined by your treatment team.  Patients discharged the day of surgery will not be allowed to drive home.    Special instructions:  [place]- Preparing For Surgery  Before surgery, you can play an important  role. Because skin is not sterile, your skin needs to be as free of germs as possible. You can reduce the number of germs on your skin by washing with CHG (chlorahexidine gluconate) Soap before surgery.  CHG is an antiseptic cleaner which kills germs and bonds with the skin to continue killing germs even after washing.    Oral Hygiene is also important to reduce your risk of infection.  Remember - BRUSH YOUR TEETH THE MORNING OF SURGERY WITH YOUR REGULAR TOOTHPASTE  Please do not use if you have an allergy to CHG or antibacterial soaps. If your skin becomes reddened/irritated stop using the CHG.  Do not shave (including legs and underarms) for at least 48 hours prior to first CHG shower. It is OK to shave your face.  Please follow these instructions carefully.   1. Shower the NIGHT BEFORE SURGERY and the MORNING OF SURGERY with CHG.   2. If you chose to wash your hair, wash your hair first as usual with your normal shampoo.  3. After you shampoo, rinse your hair and body thoroughly to remove the shampoo.  4. Use CHG as you would any other liquid soap. You can apply CHG directly to the skin and wash gently with a scrungie or a clean washcloth.   5. Apply the CHG Soap to your body ONLY FROM THE NECK DOWN.  Do not use on open wounds or open sores. Avoid contact with your eyes, ears, mouth and genitals (private parts). Wash Face and genitals (private parts)  with your normal soap.  6. Wash thoroughly, paying special attention to the area where your surgery will be performed.  7. Thoroughly rinse your body with warm water from the neck down.  8. DO NOT shower/wash with your normal soap after using and rinsing off the CHG Soap.  9. Pat yourself dry with a CLEAN TOWEL.  10. Wear CLEAN PAJAMAS to bed the night before surgery, wear comfortable clothes the morning of surgery  11. Place CLEAN SHEETS on your bed the night of your first shower and DO NOT SLEEP WITH PETS.    Day of Surgery:  Do  not apply any deodorants/lotions.  Please wear clean clothes to the hospital/surgery center.   Remember to brush your teeth WITH YOUR REGULAR TOOTHPASTE.    Please read over the following fact sheets that  you were given.

## 2020-05-25 LAB — SARS CORONAVIRUS 2 (TAT 6-24 HRS): SARS Coronavirus 2: NEGATIVE

## 2020-05-25 NOTE — H&P (Signed)
Patient ID:   [id number] Patient: Ashlee Leblanc  Date of Birth: [birth year] Visit Type: Office Visit   Date: 05/12/2020 12:30 PM Provider: Marchia Meiers. Vertell Limber MD   This 69 year old female presents for back pain.  HISTORY OF PRESENT ILLNESS: 1.  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  Currently she reports right buttock pain and right lateral thigh pain.   Norco 5/325 taken 3-4/day offering little benefit Robaxin 500 mg taken 2-3/day offered little benefit Medrol Dosepak completed without benefit  New MRI on canopy  The new MRI shows a persistent mass at the level of the right L4 pedicle within the spinal canal, felt to represent sequestered disc fragment.  She also has significant disc degeneration at the L4-5 level with stenosis related to this.  She has more minor degenerative changes at the L2-3 and L5-S1 levels.  The patient is complaining of severe right-sided low back pain and right leg pain.  This goes into her thigh and is very severe.  I explained that we had resected the synovial cyst and decompress her neural elements but that without a more involved surgery I did not think I would be able to resolved the nerve root compression on the right.  She says she cannot live the way she is and wants to go ahead with surgery.  The plan will be right TLIF at L3-4 and L4-5 levels.  Risks and benefits of surgery were discussed with the patient and she wishes to proceed on an expedited basis      Medical/Surgical/Interim History Reviewed, no change.  Last detailed document date:03/19/2020.     PAST MEDICAL HISTORY, SURGICAL HISTORY, FAMILY HISTORY, SOCIAL HISTORY AND REVIEW OF SYSTEMS I have reviewed the patient's past medical, surgical, family and social history as well as the comprehensive review of systems as included on the [facility] history  form dated 03/19/2020, which I have signed.  Family History: Reviewed, no changes.  Last detailed document date:03/19/2020.   Social History: Reviewed, no changes. Last detailed document date: 03/19/2020.    MEDICATIONS: (added, continued or stopped this visit) Started Medication Directions Instruction Stopped  citalopram 20 mg tablet     enalapril maleate 10 mg tablet     glipizide 5 mg tablet     hydrochlorothiazide 12.5 mg tablet    04/21/2020 hydrocodone 5 mg-acetaminophen 325 mg tablet take 1-2 tablet by oral route  every 4-6 hours as needed for pain    hyoscyamine ER 0.375 mg tablet,extended release,12 hr    04/21/2020 Medrol (Pak) 4 mg tablets in a dose pack take by Oral route as directed    metformin 1,000 mg tablet    04/21/2020 methocarbamol 500 mg tablet take 1 tablet by oral route 4 times every day    montelukast 10 mg tablet     pioglitazone 45 mg tablet     simvastatin 40 mg tablet       ALLERGIES: Ingredient Reaction Medication Name Comment PROPOXYPHENE HCL  Darvon  TETRACYCLINE    MORPHINE    DIPHENHYDRAMINE HCL  Benadryl   Reviewed, no changes.    PHYSICAL EXAM:  Vitals Date Temp F BP Pulse Ht In Wt Lb BMI BSA Pain Score 05/12/2020  139/83 103 66 239.8 38.7  4/10     IMPRESSION:  Significant right-sided nerve root compression L3-4 level.  There is significant degeneration at the L4-5 level with spinal  stenosis and disc herniation at this level as well.  PLAN: Proceed with right L3-4 and L4-5 TLIF with pedicle screw fixation.  The risks and benefits of surgery were discussed in detail with the patient and she wishes to proceed  Orders: Diagnostic Procedures: Assessment Procedure M54.16 Lumbar Spine- AP/Lat Instruction(s)/Education: Assessment Instruction I10 Lifestyle education [id number] Dietary management education, guidance, and  counseling Miscellaneous: Assessment  M48.061 LSO Brace  Completed Orders (this encounter) Order Details Reason Side Interpretation Result Initial Treatment Date Region Lifestyle education Patient will follow up with Primary Care Physician.       Dietary management education, guidance, and counseling Encouraged patient to eat well balanced diet.        Assessment/Plan  # Detail Type Description  1. 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     2. Assessment Lumbar spondylosis (M47.816).     3. Assessment Lumbar radiculopathy (M54.16).     4. 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  Plan Orders LSO Brace.     5. Assessment HNP (herniated nucleus pulposus), lumbar (M51.26).     6. Assessment Essential (primary) hypertension (I10).     7. Assessment Body mass index (BMI) 38.0-38.9, adult (E07.12).  Plan Orders Today's instructions / counseling include(s) Dietary management education, guidance, and counseling. Clinical information/comments: Encouraged patient to eat well balanced diet.       Pain Management Plan Pain Scale: 4/10. Method: Numeric Pain Intensity Scale. Location: back. Onset: 12/18/2019. Duration: varies. Quality: discomforting. Pain management follow-up plan of care: Patient will continue medication management..              Provider:  Marchia Meiers. Vertell Limber MD  05/12/2020 03:09 PM    Dictation edited by: Marchia Meiers. Vertell Limber    CC Providers: [facility] [address] [place] [address]-               Electronically signed by Marchia Meiers Vertell Limber MD on 05/12/2020 03:09 PM

## 2020-05-26 MED ORDER — VANCOMYCIN HCL 1500 MG/300ML IV SOLN
1500.0000 mg | INTRAVENOUS | Status: AC
  Administered 2020-05-27: 1500 mg via INTRAVENOUS
  Filled 2020-05-26: qty 300

## 2020-05-26 NOTE — Anesthesia Preprocedure Evaluation (Addendum)
Anesthesia Evaluation  Patient identified by MRN, date of birth, ID band Patient awake    Reviewed: Allergy & Precautions, NPO status , Patient's Chart, lab work & pertinent test results  History of Anesthesia Complications Negative for: history of anesthetic complications  Airway Mallampati: III  TM Distance: >3 FB Neck ROM: Full    Dental no notable dental hx. (+) Dental Advisory Given   Pulmonary sleep apnea , former smoker,    Pulmonary exam normal  (-) decreased breath sounds      Cardiovascular hypertension, Pt. on medications Normal cardiovascular exam     Neuro/Psych negative neurological ROS  negative psych ROS   GI/Hepatic Neg liver ROS, GERD  ,  Endo/Other  diabetesMorbid obesity  Renal/GU negative Renal ROS  negative genitourinary   Musculoskeletal negative musculoskeletal ROS (+)   Abdominal (+) + obese,   Peds negative pediatric ROS (+)  Hematology negative hematology ROS (+)   Anesthesia Other Findings   Reproductive/Obstetrics negative OB ROS                            Anesthesia Physical  Anesthesia Plan  ASA: III  Anesthesia Plan: General   Post-op Pain Management:    Induction: Intravenous  PONV Risk Score and Plan: 4 or greater and Ondansetron, Dexamethasone, Midazolam and Treatment may vary due to age or medical condition  Airway Management Planned: Oral ETT  Additional Equipment:   Intra-op Plan:   Post-operative Plan: Extubation in OR  Informed Consent: I have reviewed the patients History and Physical, chart, labs and discussed the procedure including the risks, benefits and alternatives for the proposed anesthesia with the patient or authorized representative who has indicated his/her understanding and acceptance.     Dental advisory given  Plan Discussed with: CRNA and Anesthesiologist  Anesthesia Plan Comments: ( )       Anesthesia  Quick Evaluation

## 2020-05-27 ENCOUNTER — Inpatient Hospital Stay (HOSPITAL_COMMUNITY)
Admission: RE | Admit: 2020-05-27 | Discharge: 2020-05-28 | DRG: 455 | Disposition: A | Discharge status: Home / Self Care | Payer: Medicare Other | Attending: Neurosurgery | Admitting: Neurosurgery

## 2020-05-27 ENCOUNTER — Inpatient Hospital Stay (HOSPITAL_COMMUNITY): Payer: Medicare Other | Admitting: Anesthesiology

## 2020-05-27 ENCOUNTER — Encounter (HOSPITAL_COMMUNITY)
Admission: RE | Disposition: A | Discharge status: Home / Self Care | Payer: Self-pay | Source: Home / Self Care | Attending: Neurosurgery

## 2020-05-27 ENCOUNTER — Encounter (HOSPITAL_COMMUNITY): Payer: Self-pay | Admitting: Neurosurgery

## 2020-05-27 ENCOUNTER — Inpatient Hospital Stay (HOSPITAL_COMMUNITY): Payer: Medicare Other | Admitting: Physician Assistant

## 2020-05-27 ENCOUNTER — Inpatient Hospital Stay (HOSPITAL_COMMUNITY): Payer: Medicare Other

## 2020-05-27 ENCOUNTER — Other Ambulatory Visit: Payer: Self-pay

## 2020-05-27 DIAGNOSIS — Z7984 Long term (current) use of oral hypoglycemic drugs: Secondary | ICD-10-CM | POA: Diagnosis not present

## 2020-05-27 DIAGNOSIS — M4316 Spondylolisthesis, lumbar region: Secondary | ICD-10-CM | POA: Diagnosis present

## 2020-05-27 DIAGNOSIS — E118 Type 2 diabetes mellitus with unspecified complications: Secondary | ICD-10-CM | POA: Diagnosis present

## 2020-05-27 DIAGNOSIS — M545 Low back pain, unspecified: Secondary | ICD-10-CM | POA: Diagnosis present

## 2020-05-27 DIAGNOSIS — M4726 Other spondylosis with radiculopathy, lumbar region: Secondary | ICD-10-CM | POA: Diagnosis present

## 2020-05-27 DIAGNOSIS — M48061 Spinal stenosis, lumbar region without neurogenic claudication: Secondary | ICD-10-CM | POA: Diagnosis present

## 2020-05-27 DIAGNOSIS — M419 Scoliosis, unspecified: Secondary | ICD-10-CM | POA: Diagnosis present

## 2020-05-27 DIAGNOSIS — Z885 Allergy status to narcotic agent status: Secondary | ICD-10-CM

## 2020-05-27 DIAGNOSIS — Z79899 Other long term (current) drug therapy: Secondary | ICD-10-CM | POA: Diagnosis not present

## 2020-05-27 DIAGNOSIS — E785 Hyperlipidemia, unspecified: Secondary | ICD-10-CM | POA: Diagnosis present

## 2020-05-27 DIAGNOSIS — Z881 Allergy status to other antibiotic agents status: Secondary | ICD-10-CM | POA: Diagnosis not present

## 2020-05-27 DIAGNOSIS — M5116 Intervertebral disc disorders with radiculopathy, lumbar region: Secondary | ICD-10-CM | POA: Diagnosis present

## 2020-05-27 DIAGNOSIS — Z888 Allergy status to other drugs, medicaments and biological substances status: Secondary | ICD-10-CM | POA: Diagnosis not present

## 2020-05-27 DIAGNOSIS — Z419 Encounter for procedure for purposes other than remedying health state, unspecified: Secondary | ICD-10-CM

## 2020-05-27 DIAGNOSIS — K219 Gastro-esophageal reflux disease without esophagitis: Secondary | ICD-10-CM | POA: Diagnosis present

## 2020-05-27 DIAGNOSIS — Z20822 Contact with and (suspected) exposure to covid-19: Secondary | ICD-10-CM | POA: Diagnosis present

## 2020-05-27 DIAGNOSIS — I10 Essential (primary) hypertension: Secondary | ICD-10-CM | POA: Diagnosis present

## 2020-05-27 HISTORY — PX: TRANSFORAMINAL LUMBAR INTERBODY FUSION (TLIF) WITH PEDICLE SCREW FIXATION 2 LEVEL: SHX6142

## 2020-05-27 LAB — GLUCOSE, CAPILLARY
Glucose-Capillary: 168 mg/dL — ABNORMAL HIGH (ref 70–99)
Glucose-Capillary: 273 mg/dL — ABNORMAL HIGH (ref 70–99)
Glucose-Capillary: 296 mg/dL — ABNORMAL HIGH (ref 70–99)
Glucose-Capillary: 272 mg/dL — ABNORMAL HIGH (ref 70–99)
Glucose-Capillary: 262 mg/dL — ABNORMAL HIGH (ref 70–99)

## 2020-05-27 LAB — ABO/RH: ABO/RH(D): A POS

## 2020-05-27 SURGERY — TRANSFORAMINAL LUMBAR INTERBODY FUSION (TLIF) WITH PEDICLE SCREW FIXATION 2 LEVEL
Anesthesia: General | Site: Spine Lumbar | Laterality: Right

## 2020-05-27 MED ORDER — OXYCODONE HCL 5 MG PO TABS: 5.0000 mg | ORAL_TABLET | ORAL | Status: DC | PRN

## 2020-05-27 MED ORDER — PHENOL 1.4 % MT LIQD
1.0000 | OROMUCOSAL | Status: DC | PRN
  Filled 2020-05-27: qty 177

## 2020-05-27 MED ORDER — DEXAMETHASONE SODIUM PHOSPHATE 10 MG/ML IJ SOLN
INTRAMUSCULAR | Status: AC
  Filled 2020-05-27: qty 1

## 2020-05-27 MED ORDER — FENTANYL CITRATE (PF) 250 MCG/5ML IJ SOLN
INTRAMUSCULAR | Status: DC | PRN
  Administered 2020-05-27: 50 ug via INTRAVENOUS
  Administered 2020-05-27: 100 ug via INTRAVENOUS
  Administered 2020-05-27 (×2): 50 ug via INTRAVENOUS

## 2020-05-27 MED ORDER — METFORMIN HCL 500 MG PO TABS
1000.0000 mg | ORAL_TABLET | Freq: Two times a day (BID) | ORAL | Status: DC
  Administered 2020-05-27 – 2020-05-28 (×2): 1000 mg via ORAL
  Filled 2020-05-27 (×2): qty 2

## 2020-05-27 MED ORDER — BISACODYL 10 MG RE SUPP: 10.0000 mg | Freq: Every day | RECTAL | Status: DC | PRN

## 2020-05-27 MED ORDER — BUPIVACAINE LIPOSOME 1.3 % IJ SUSP
20.0000 mL | INTRAMUSCULAR | Status: DC
  Filled 2020-05-27: qty 20

## 2020-05-27 MED ORDER — LIDOCAINE-EPINEPHRINE 1 %-1:100000 IJ SOLN
INTRAMUSCULAR | Status: DC | PRN
  Administered 2020-05-27: 10 mL

## 2020-05-27 MED ORDER — GABAPENTIN 300 MG PO CAPS
300.0000 mg | ORAL_CAPSULE | Freq: Three times a day (TID) | ORAL | Status: DC
  Administered 2020-05-27 – 2020-05-28 (×3): 300 mg via ORAL
  Filled 2020-05-27 (×3): qty 1

## 2020-05-27 MED ORDER — FLUTICASONE PROPIONATE 50 MCG/ACT NA SUSP: 2.0000 | Freq: Every day | NASAL | Status: DC | PRN

## 2020-05-27 MED ORDER — LIDOCAINE 2% (20 MG/ML) 5 ML SYRINGE
INTRAMUSCULAR | Status: DC | PRN
  Administered 2020-05-27: 100 mg via INTRAVENOUS

## 2020-05-27 MED ORDER — ORAL CARE MOUTH RINSE: 15.0000 mL | Freq: Once | OROMUCOSAL | Status: AC

## 2020-05-27 MED ORDER — PHENYLEPHRINE HCL (PRESSORS) 10 MG/ML IV SOLN
INTRAVENOUS | Status: AC
  Filled 2020-05-27: qty 1

## 2020-05-27 MED ORDER — PROMETHAZINE HCL 25 MG/ML IJ SOLN: 6.2500 mg | INTRAMUSCULAR | Status: DC | PRN

## 2020-05-27 MED ORDER — METHOCARBAMOL 1000 MG/10ML IJ SOLN
500.0000 mg | Freq: Four times a day (QID) | INTRAVENOUS | Status: DC | PRN
  Filled 2020-05-27: qty 5

## 2020-05-27 MED ORDER — CHLORHEXIDINE GLUCONATE 0.12 % MT SOLN
15.0000 mL | Freq: Once | OROMUCOSAL | Status: AC
  Administered 2020-05-27: 15 mL via OROMUCOSAL
  Filled 2020-05-27: qty 15

## 2020-05-27 MED ORDER — ONDANSETRON HCL 4 MG/2ML IJ SOLN
INTRAMUSCULAR | Status: AC
  Filled 2020-05-27: qty 2

## 2020-05-27 MED ORDER — ACETAMINOPHEN 325 MG PO TABS: 650.0000 mg | ORAL_TABLET | ORAL | Status: DC | PRN

## 2020-05-27 MED ORDER — HYDROMORPHONE HCL 1 MG/ML IJ SOLN: 0.5000 mg | Freq: Once | INTRAMUSCULAR | Status: DC

## 2020-05-27 MED ORDER — KCL IN DEXTROSE-NACL 20-5-0.45 MEQ/L-%-% IV SOLN: INTRAVENOUS | Status: DC

## 2020-05-27 MED ORDER — HYDROCHLOROTHIAZIDE 12.5 MG PO CAPS
12.5000 mg | ORAL_CAPSULE | Freq: Every day | ORAL | Status: DC
  Administered 2020-05-27 – 2020-05-28 (×2): 12.5 mg via ORAL
  Filled 2020-05-27 (×2): qty 1

## 2020-05-27 MED ORDER — PHENYLEPHRINE 40 MCG/ML (10ML) SYRINGE FOR IV PUSH (FOR BLOOD PRESSURE SUPPORT)
PREFILLED_SYRINGE | INTRAVENOUS | Status: AC
  Filled 2020-05-27: qty 10

## 2020-05-27 MED ORDER — CHLORHEXIDINE GLUCONATE CLOTH 2 % EX PADS: 6.0000 | MEDICATED_PAD | Freq: Once | CUTANEOUS | Status: DC

## 2020-05-27 MED ORDER — BUPIVACAINE LIPOSOME 1.3 % IJ SUSP
INTRAMUSCULAR | Status: DC | PRN
  Administered 2020-05-27: 20 mL

## 2020-05-27 MED ORDER — SODIUM CHLORIDE 0.9 % IV SOLN
250.0000 mL | INTRAVENOUS | Status: DC
  Administered 2020-05-27: 250 mL via INTRAVENOUS

## 2020-05-27 MED ORDER — EPHEDRINE 5 MG/ML INJ
INTRAVENOUS | Status: AC
  Filled 2020-05-27: qty 10

## 2020-05-27 MED ORDER — SIMVASTATIN 20 MG PO TABS
40.0000 mg | ORAL_TABLET | Freq: Every evening | ORAL | Status: DC
Start: 2020-05-27 — End: 2020-05-28
  Administered 2020-05-27: 40 mg via ORAL
  Filled 2020-05-27: qty 2

## 2020-05-27 MED ORDER — SUGAMMADEX SODIUM 200 MG/2ML IV SOLN
INTRAVENOUS | Status: DC | PRN
  Administered 2020-05-27: 200 mg via INTRAVENOUS

## 2020-05-27 MED ORDER — HYDROMORPHONE HCL 1 MG/ML IJ SOLN: 0.5000 mg | INTRAMUSCULAR | Status: DC | PRN

## 2020-05-27 MED ORDER — HYDROCODONE-ACETAMINOPHEN 5-325 MG PO TABS
1.0000 | ORAL_TABLET | ORAL | Status: DC | PRN
  Administered 2020-05-27 – 2020-05-28 (×4): 2 via ORAL
  Filled 2020-05-27 (×4): qty 2

## 2020-05-27 MED ORDER — LIDOCAINE-EPINEPHRINE 1 %-1:100000 IJ SOLN
INTRAMUSCULAR | Status: AC
  Filled 2020-05-27: qty 1

## 2020-05-27 MED ORDER — 0.9 % SODIUM CHLORIDE (POUR BTL) OPTIME
TOPICAL | Status: DC | PRN
  Administered 2020-05-27: 1000 mL

## 2020-05-27 MED ORDER — SODIUM CHLORIDE 0.9% FLUSH: 3.0000 mL | INTRAVENOUS | Status: DC | PRN

## 2020-05-27 MED ORDER — THROMBIN 5000 UNITS EX SOLR: OROMUCOSAL | Status: DC | PRN

## 2020-05-27 MED ORDER — INSULIN ASPART 100 UNIT/ML ~~LOC~~ SOLN
4.0000 [IU] | Freq: Three times a day (TID) | SUBCUTANEOUS | Status: DC
  Administered 2020-05-27 – 2020-05-28 (×2): 4 [IU] via SUBCUTANEOUS

## 2020-05-27 MED ORDER — BUPIVACAINE HCL (PF) 0.5 % IJ SOLN
INTRAMUSCULAR | Status: AC
  Filled 2020-05-27: qty 30

## 2020-05-27 MED ORDER — ZOLPIDEM TARTRATE 5 MG PO TABS: 5.0000 mg | ORAL_TABLET | Freq: Every evening | ORAL | Status: DC | PRN

## 2020-05-27 MED ORDER — ONDANSETRON HCL 4 MG/2ML IJ SOLN: 4.0000 mg | Freq: Four times a day (QID) | INTRAMUSCULAR | Status: DC | PRN

## 2020-05-27 MED ORDER — PHENYLEPHRINE 40 MCG/ML (10ML) SYRINGE FOR IV PUSH (FOR BLOOD PRESSURE SUPPORT)
PREFILLED_SYRINGE | INTRAVENOUS | Status: DC | PRN
  Administered 2020-05-27 (×2): 200 ug via INTRAVENOUS

## 2020-05-27 MED ORDER — MENTHOL 3 MG MT LOZG
1.0000 | LOZENGE | OROMUCOSAL | Status: DC | PRN
  Filled 2020-05-27: qty 9

## 2020-05-27 MED ORDER — METHOCARBAMOL 500 MG PO TABS
500.0000 mg | ORAL_TABLET | Freq: Four times a day (QID) | ORAL | Status: DC | PRN
  Administered 2020-05-27: 500 mg via ORAL
  Filled 2020-05-27 (×2): qty 1

## 2020-05-27 MED ORDER — SODIUM CHLORIDE 0.9% FLUSH
3.0000 mL | Freq: Two times a day (BID) | INTRAVENOUS | Status: DC
  Administered 2020-05-27: 3 mL via INTRAVENOUS

## 2020-05-27 MED ORDER — DOCUSATE SODIUM 100 MG PO CAPS
100.0000 mg | ORAL_CAPSULE | Freq: Two times a day (BID) | ORAL | Status: DC
  Administered 2020-05-27: 100 mg via ORAL
  Filled 2020-05-27: qty 1

## 2020-05-27 MED ORDER — INSULIN ASPART 100 UNIT/ML ~~LOC~~ SOLN
0.0000 [IU] | Freq: Every day | SUBCUTANEOUS | Status: DC
  Administered 2020-05-27: 3 [IU] via SUBCUTANEOUS

## 2020-05-27 MED ORDER — KETOROLAC TROMETHAMINE 15 MG/ML IJ SOLN
7.5000 mg | Freq: Four times a day (QID) | INTRAMUSCULAR | Status: DC
  Administered 2020-05-27 – 2020-05-28 (×3): 7.5 mg via INTRAVENOUS
  Filled 2020-05-27 (×3): qty 1

## 2020-05-27 MED ORDER — ROCURONIUM BROMIDE 10 MG/ML (PF) SYRINGE
PREFILLED_SYRINGE | INTRAVENOUS | Status: DC | PRN
  Administered 2020-05-27: 100 mg via INTRAVENOUS

## 2020-05-27 MED ORDER — CEFAZOLIN SODIUM-DEXTROSE 2-4 GM/100ML-% IV SOLN: 2.0000 g | INTRAVENOUS | Status: DC

## 2020-05-27 MED ORDER — THROMBIN 5000 UNITS EX SOLR
CUTANEOUS | Status: AC
  Filled 2020-05-27: qty 5000

## 2020-05-27 MED ORDER — ALBUMIN HUMAN 5 % IV SOLN: INTRAVENOUS | Status: DC | PRN

## 2020-05-27 MED ORDER — MONTELUKAST SODIUM 10 MG PO TABS
10.0000 mg | ORAL_TABLET | Freq: Every day | ORAL | Status: DC
  Administered 2020-05-27: 10 mg via ORAL
  Filled 2020-05-27: qty 1

## 2020-05-27 MED ORDER — MIDAZOLAM HCL 2 MG/2ML IJ SOLN
INTRAMUSCULAR | Status: DC | PRN
  Administered 2020-05-27: 2 mg via INTRAVENOUS

## 2020-05-27 MED ORDER — DEXAMETHASONE SODIUM PHOSPHATE 10 MG/ML IJ SOLN
INTRAMUSCULAR | Status: DC | PRN
  Administered 2020-05-27: 10 mg via INTRAVENOUS

## 2020-05-27 MED ORDER — PROPOFOL 10 MG/ML IV BOLUS
INTRAVENOUS | Status: DC | PRN
  Administered 2020-05-27: 170 mg via INTRAVENOUS

## 2020-05-27 MED ORDER — ASCORBIC ACID 500 MG PO TABS
500.0000 mg | ORAL_TABLET | Freq: Every day | ORAL | Status: DC
  Administered 2020-05-28: 500 mg via ORAL
  Filled 2020-05-27: qty 1

## 2020-05-27 MED ORDER — GLIPIZIDE ER 5 MG PO TB24
5.0000 mg | ORAL_TABLET | Freq: Two times a day (BID) | ORAL | Status: DC
  Administered 2020-05-27 – 2020-05-28 (×2): 5 mg via ORAL
  Filled 2020-05-27 (×3): qty 1

## 2020-05-27 MED ORDER — FENTANYL CITRATE (PF) 100 MCG/2ML IJ SOLN: 25.0000 ug | INTRAMUSCULAR | Status: DC | PRN

## 2020-05-27 MED ORDER — INSULIN ASPART 100 UNIT/ML ~~LOC~~ SOLN
0.0000 [IU] | Freq: Three times a day (TID) | SUBCUTANEOUS | Status: DC
  Administered 2020-05-27 (×2): 8 [IU] via SUBCUTANEOUS
  Administered 2020-05-28: 2 [IU] via SUBCUTANEOUS

## 2020-05-27 MED ORDER — PANTOPRAZOLE SODIUM 40 MG PO TBEC
40.0000 mg | DELAYED_RELEASE_TABLET | Freq: Every day | ORAL | Status: DC
  Administered 2020-05-27 – 2020-05-28 (×2): 40 mg via ORAL
  Filled 2020-05-27 (×2): qty 1

## 2020-05-27 MED ORDER — PROPOFOL 10 MG/ML IV BOLUS
INTRAVENOUS | Status: AC
  Filled 2020-05-27: qty 20

## 2020-05-27 MED ORDER — CITALOPRAM HYDROBROMIDE 20 MG PO TABS
20.0000 mg | ORAL_TABLET | Freq: Every day | ORAL | Status: DC
  Administered 2020-05-27 – 2020-05-28 (×2): 20 mg via ORAL
  Filled 2020-05-27 (×2): qty 1

## 2020-05-27 MED ORDER — ACETAMINOPHEN 500 MG PO TABS
1000.0000 mg | ORAL_TABLET | Freq: Once | ORAL | Status: AC
  Administered 2020-05-27: 1000 mg via ORAL
  Filled 2020-05-27: qty 2

## 2020-05-27 MED ORDER — THROMBIN 20000 UNITS EX SOLR: CUTANEOUS | Status: DC | PRN

## 2020-05-27 MED ORDER — FLEET ENEMA 7-19 GM/118ML RE ENEM: 1.0000 | ENEMA | Freq: Once | RECTAL | Status: DC | PRN

## 2020-05-27 MED ORDER — EPHEDRINE SULFATE-NACL 50-0.9 MG/10ML-% IV SOSY
PREFILLED_SYRINGE | INTRAVENOUS | Status: DC | PRN
  Administered 2020-05-27: 20 mg via INTRAVENOUS
  Administered 2020-05-27: 10 mg via INTRAVENOUS
  Administered 2020-05-27: 15 mg via INTRAVENOUS
  Administered 2020-05-27: 5 mg via INTRAVENOUS

## 2020-05-27 MED ORDER — VITAMIN D 25 MCG (1000 UNIT) PO TABS
5000.0000 [IU] | ORAL_TABLET | Freq: Every day | ORAL | Status: DC
  Administered 2020-05-27: 5000 [IU] via ORAL
  Filled 2020-05-27: qty 5

## 2020-05-27 MED ORDER — LACTATED RINGERS IV SOLN: INTRAVENOUS | Status: DC

## 2020-05-27 MED ORDER — HYOSCYAMINE SULFATE ER 0.375 MG PO TB12
0.3750 mg | ORAL_TABLET | Freq: Every day | ORAL | Status: DC
  Administered 2020-05-28: 0.375 mg via ORAL
  Filled 2020-05-27: qty 1

## 2020-05-27 MED ORDER — ALUM & MAG HYDROXIDE-SIMETH 200-200-20 MG/5ML PO SUSP: 30.0000 mL | Freq: Four times a day (QID) | ORAL | Status: DC | PRN

## 2020-05-27 MED ORDER — PIOGLITAZONE HCL 45 MG PO TABS
45.0000 mg | ORAL_TABLET | Freq: Every day | ORAL | Status: DC
  Administered 2020-05-28: 45 mg via ORAL
  Filled 2020-05-27 (×2): qty 1

## 2020-05-27 MED ORDER — METHOCARBAMOL 500 MG PO TABS: 500.0000 mg | ORAL_TABLET | Freq: Four times a day (QID) | ORAL | Status: DC | PRN

## 2020-05-27 MED ORDER — PHENYLEPHRINE HCL-NACL 10-0.9 MG/250ML-% IV SOLN
INTRAVENOUS | Status: DC | PRN
  Administered 2020-05-27: 50 ug/min via INTRAVENOUS

## 2020-05-27 MED ORDER — ADULT MULTIVITAMIN W/MINERALS CH: 1.0000 | ORAL_TABLET | Freq: Every day | ORAL | Status: DC

## 2020-05-27 MED ORDER — CALCIUM CARBONATE-VITAMIN D 600-200 MG-UNIT PO TABS: ORAL_TABLET | Freq: Every day | ORAL | Status: DC

## 2020-05-27 MED ORDER — ONDANSETRON HCL 4 MG/2ML IJ SOLN
INTRAMUSCULAR | Status: DC | PRN
  Administered 2020-05-27: 4 mg via INTRAVENOUS

## 2020-05-27 MED ORDER — INSULIN ASPART 100 UNIT/ML ~~LOC~~ SOLN
SUBCUTANEOUS | Status: AC
  Filled 2020-05-27: qty 1

## 2020-05-27 MED ORDER — ROCURONIUM BROMIDE 10 MG/ML (PF) SYRINGE
PREFILLED_SYRINGE | INTRAVENOUS | Status: AC
  Filled 2020-05-27: qty 10

## 2020-05-27 MED ORDER — ENALAPRIL MALEATE 5 MG PO TABS
10.0000 mg | ORAL_TABLET | Freq: Every day | ORAL | Status: DC
  Administered 2020-05-27: 10 mg via ORAL
  Filled 2020-05-27 (×2): qty 2

## 2020-05-27 MED ORDER — THROMBIN 20000 UNITS EX SOLR
CUTANEOUS | Status: AC
  Filled 2020-05-27: qty 20000

## 2020-05-27 MED ORDER — MIDAZOLAM HCL 2 MG/2ML IJ SOLN
INTRAMUSCULAR | Status: AC
  Filled 2020-05-27: qty 2

## 2020-05-27 MED ORDER — CALCIUM CARBONATE-VITAMIN D 500-200 MG-UNIT PO TABS
1.0000 | ORAL_TABLET | Freq: Every day | ORAL | Status: DC
  Administered 2020-05-28: 07:00:00 1 via ORAL
  Filled 2020-05-27: qty 1

## 2020-05-27 MED ORDER — POLYETHYLENE GLYCOL 3350 17 G PO PACK: 17.0000 g | PACK | Freq: Every day | ORAL | Status: DC | PRN

## 2020-05-27 MED ORDER — ONDANSETRON HCL 4 MG PO TABS: 4.0000 mg | ORAL_TABLET | Freq: Four times a day (QID) | ORAL | Status: DC | PRN

## 2020-05-27 MED ORDER — CELECOXIB 200 MG PO CAPS
200.0000 mg | ORAL_CAPSULE | Freq: Once | ORAL | Status: AC
  Administered 2020-05-27: 200 mg via ORAL
  Filled 2020-05-27: qty 1

## 2020-05-27 MED ORDER — LIDOCAINE 2% (20 MG/ML) 5 ML SYRINGE
INTRAMUSCULAR | Status: AC
  Filled 2020-05-27: qty 5

## 2020-05-27 MED ORDER — PANTOPRAZOLE SODIUM 40 MG IV SOLR: 40.0000 mg | Freq: Every day | INTRAVENOUS | Status: DC

## 2020-05-27 MED ORDER — FENTANYL CITRATE (PF) 250 MCG/5ML IJ SOLN
INTRAMUSCULAR | Status: AC
  Filled 2020-05-27: qty 5

## 2020-05-27 MED ORDER — SODIUM CHLORIDE 0.9 % IV SOLN: INTRAVENOUS | Status: DC | PRN

## 2020-05-27 MED ORDER — VITAMIN E 45 MG (100 UNIT) PO CAPS
400.0000 [IU] | ORAL_CAPSULE | Freq: Every day | ORAL | Status: DC
  Administered 2020-05-28: 400 [IU] via ORAL
  Filled 2020-05-27: qty 4

## 2020-05-27 MED ORDER — ACETAMINOPHEN 650 MG RE SUPP: 650.0000 mg | RECTAL | Status: DC | PRN

## 2020-05-27 SURGICAL SUPPLY — 65 items
BAND RUBBER #18 3X1/16 STRL (MISCELLANEOUS) ×4 IMPLANT
BASKET BONE COLLECTION (BASKET) ×2 IMPLANT
BONE CANC CHIPS 40CC CAN1/2 (Bone Implant) ×2 IMPLANT
BUR MATCHSTICK NEURO 3.0 LAGG (BURR) ×2 IMPLANT
BUR PRECISION FLUTE 5.0 (BURR) ×2 IMPLANT
CANISTER SUCT 3000ML PPV (MISCELLANEOUS) ×2 IMPLANT
CARTRIDGE OIL MAESTRO DRILL (MISCELLANEOUS) ×1 IMPLANT
CHIPS CANC BONE 40CC CAN1/2 (Bone Implant) ×1 IMPLANT
CNTNR URN SCR LID CUP LEK RST (MISCELLANEOUS) ×1 IMPLANT
CONT SPEC 4OZ STRL OR WHT (MISCELLANEOUS) ×1
COVER BACK TABLE 60X90IN (DRAPES) ×2 IMPLANT
DERMABOND ADVANCED (GAUZE/BANDAGES/DRESSINGS) ×1
DERMABOND ADVANCED .7 DNX12 (GAUZE/BANDAGES/DRESSINGS) ×1 IMPLANT
DIFFUSER DRILL AIR PNEUMATIC (MISCELLANEOUS) ×2 IMPLANT
DRAPE C-ARM 42X72 X-RAY (DRAPES) ×2 IMPLANT
DRAPE C-ARMOR (DRAPES) ×2 IMPLANT
DRAPE LAPAROTOMY 100X72X124 (DRAPES) ×2 IMPLANT
DRAPE MICROSCOPE LEICA (MISCELLANEOUS) ×2 IMPLANT
DRAPE SURG 17X23 STRL (DRAPES) ×2 IMPLANT
DRSG OPSITE POSTOP 4X6 (GAUZE/BANDAGES/DRESSINGS) ×2 IMPLANT
DURAPREP 26ML APPLICATOR (WOUND CARE) ×2 IMPLANT
ELECT REM PT RETURN 9FT ADLT (ELECTROSURGICAL) ×2
ELECTRODE REM PT RTRN 9FT ADLT (ELECTROSURGICAL) ×1 IMPLANT
GAUZE 4X4 16PLY RFD (DISPOSABLE) IMPLANT
GAUZE SPONGE 4X4 12PLY STRL (GAUZE/BANDAGES/DRESSINGS) IMPLANT
GLOVE BIO SURGEON STRL SZ8 (GLOVE) ×4 IMPLANT
GLOVE BIOGEL PI IND STRL 8 (GLOVE) ×2 IMPLANT
GLOVE BIOGEL PI IND STRL 8.5 (GLOVE) ×2 IMPLANT
GLOVE BIOGEL PI INDICATOR 8 (GLOVE) ×2
GLOVE BIOGEL PI INDICATOR 8.5 (GLOVE) ×2
GLOVE ECLIPSE 8.0 STRL XLNG CF (GLOVE) ×4 IMPLANT
GOWN STRL REUS W/ TWL LRG LVL3 (GOWN DISPOSABLE) IMPLANT
GOWN STRL REUS W/ TWL XL LVL3 (GOWN DISPOSABLE) ×3 IMPLANT
GOWN STRL REUS W/TWL 2XL LVL3 (GOWN DISPOSABLE) ×4 IMPLANT
GOWN STRL REUS W/TWL LRG LVL3 (GOWN DISPOSABLE)
GOWN STRL REUS W/TWL XL LVL3 (GOWN DISPOSABLE) ×3
HEMOSTAT POWDER KIT SURGIFOAM (HEMOSTASIS) ×2 IMPLANT
IMPL TLX20 7X11X26 20D (Cage) ×1 IMPLANT
IMPL TLX20 8X11X26 20D (Cage) ×1 IMPLANT
KIT BASIN OR (CUSTOM PROCEDURE TRAY) ×2 IMPLANT
KIT INFUSE X SMALL 1.4CC (Orthopedic Implant) ×2 IMPLANT
KIT POSITION SURG JACKSON T1 (MISCELLANEOUS) ×2 IMPLANT
KIT TURNOVER KIT B (KITS) ×2 IMPLANT
NEEDLE HYPO 25X1 1.5 SAFETY (NEEDLE) ×2 IMPLANT
NEEDLE SPNL 18GX3.5 QUINCKE PK (NEEDLE) ×2 IMPLANT
NS IRRIG 1000ML POUR BTL (IV SOLUTION) ×2 IMPLANT
OIL CARTRIDGE MAESTRO DRILL (MISCELLANEOUS) ×2
PACK LAMINECTOMY NEURO (CUSTOM PROCEDURE TRAY) ×2 IMPLANT
ROD RELIN-O LORD 5.5X65MM (Rod) ×4 IMPLANT
SCREW LOCK RELINE 5.5 TULIP (Screw) ×12 IMPLANT
SCREW RELINE-O POLY 6.5X45 (Screw) ×4 IMPLANT
SCREW RELINE-O POLY 6.5X50MM (Screw) ×8 IMPLANT
SPONGE LAP 4X18 RFD (DISPOSABLE) IMPLANT
SPONGE SURGIFOAM ABS GEL 100 (HEMOSTASIS) ×2 IMPLANT
STAPLER SKIN PROX WIDE 3.9 (STAPLE) IMPLANT
SUT VIC AB 1 CT1 18XBRD ANBCTR (SUTURE) ×2 IMPLANT
SUT VIC AB 1 CT1 8-18 (SUTURE) ×2
SUT VIC AB 2-0 CT1 18 (SUTURE) ×4 IMPLANT
SUT VIC AB 3-0 SH 8-18 (SUTURE) ×4 IMPLANT
TLX20 IMPLANT 7X11X26 20D (Cage) ×2 IMPLANT
TLX20 IMPLANT 8X11X26 20D (Cage) ×2 IMPLANT
TOWEL GREEN STERILE (TOWEL DISPOSABLE) ×2 IMPLANT
TOWEL GREEN STERILE FF (TOWEL DISPOSABLE) ×2 IMPLANT
TRAY FOLEY MTR SLVR 16FR STAT (SET/KITS/TRAYS/PACK) ×2 IMPLANT
WATER STERILE IRR 1000ML POUR (IV SOLUTION) ×2 IMPLANT

## 2020-05-27 NOTE — Progress Notes (Signed)
Awake, alert, conversant.  Patient states right leg no longer hurts.  Strength full both lower extremities DF/PF/EHL.  Doing well.

## 2020-05-27 NOTE — Interval H&P Note (Signed)
History and Physical Interval Note:  05/27/2020 7:23 AM  Ashlee Leblanc  has presented today for surgery, with the diagnosis of Foraminal stenosis of lumbar region.  The various methods of treatment have been discussed with the patient and family. After consideration of risks, benefits and other options for treatment, the patient has consented to  Procedure(s) with comments: Right Lumbar 3-4 Lumbar 4-5 Transforaminal lumbar interbody fusion with Redo decompression of Right Lumbar 3-4 (Right) - 3C/RM 21 as a surgical intervention.  The patient's history has been reviewed, patient examined, no change in status, stable for surgery.  I have reviewed the patient's chart and labs.  Questions were answered to the patient's satisfaction.     [name]

## 2020-05-27 NOTE — Op Note (Signed)
05/27/2020  12:03 PM  PATIENT:  Ashlee Leblanc  69 y.o. female  PRE-OPERATIVE DIAGNOSIS:  Foraminal stenosis of lumbar region, scoliosis, recurrent disc herniation, lumbago, radiculopathy L 34, L 45 levels  POST-OPERATIVE DIAGNOSIS:   Foraminal stenosis of lumbar region, scoliosis, recurrent disc herniation, lumbago, radiculopathy L 34, L 45 levels  PROCEDURE:  Procedure(s): Right Lumbar Three-Four Lumbar Four-Five Transforaminal lumbar interbody fusion with Redo Decompression of Right Lumbar Three-Four (Right) with expandable titanium cages, autograft, pedicle screw fixation, posterolateral arthrodesis  SURGEON:  Surgeon(s) and Role:    Erline Levine, MD - Primary    * Dawley, Theodoro Doing, DO - Assisting  PHYSICIAN ASSISTANT:   ASSISTANTS: Poteat, RN   ANESTHESIA:   general  EBL:  400 mL   BLOOD ADMINISTERED:190 CC CELLSAVER  DRAINS: (Medium) Hemovact drain(s) in the epidural space with  Suction Open   LOCAL MEDICATIONS USED:  MARCAINE    and LIDOCAINE   SPECIMEN:  No Specimen  DISPOSITION OF SPECIMEN:  N/A  COUNTS:  YES  TOURNIQUET:  * No tourniquets in log *  DICTATION: Patient is 69 year old woman with  HNP, spondylosis, scoliosis,  stenosis, DDD, radiculopathy L 34 and L 45 levels. She has a severe right leg pain and weakness. It was elected to take her to surgery for redo decompression and fusion at L 34 and L 45 levels.    Procedure: Patient was placed in a prone position on the [place] table after smooth and uncomplicated induction of general endotracheal anesthesia. Her low back was prepped and draped in usual sterile fashion with betadine scrub and DuraPrep after marking relevant bony anatomy with C arm. Area of incision was infiltrated with local lidocaine. Incision was made to the lumbodorsal fascia was incised and exposure was performed of the L 34 - L 45 spinous processes laminae facet joint and transverse processes. Intraoperative x-ray was obtained which  confirmed correct orientation. A total right hemi-laminectomy of L 34 and L 45 was performed with disarticulation of the facet joints at this level and thorough decompression was performed of both L 3, L 4,  L 5 nerve roots along with the common dural tube. There was densely adherent spondylytic material compressing the thecal sac and both L4 and L5 nerve roots.  A thorough redo discectomy and preparation of the endplates was performed at the L 34 level.  We used the operating microscope to insure complete decompression of the right L 4 nerve root and perform the right L 34 redo microdiscectomy.  This included wide foraminotomy along the L 4 nerve root with decompression within the axilla. We also performed wide decompression at the L 45 level on the right.  Bone was saved for later bone grafting.   The interspaces were packed with extra small BMP,and expandable titanium TLX cages ( 7 x 11 x 26 mm x 20 degrees at L 34 and 8 x 11 x 26 mm 20 degree at L 45).   5 cc of autograft was placed in each interspace prior to cage placement. The posterolateral region was extensively decorticated and pedicle probes were placed at L 3, L 4  and L 5 bilaterally. Intraoperative fluoroscopy confirmed correct orientationin the AP and lateral plane. 50 x 6.5 mm pedicle screws were placed at left L 3, L 4, L 5 levels as well as right L 3.  6.5 x 45 mm screws were placed on the right at L 4 and L 5. Final x-rays demonstrated well-positioned interbody grafts and  pedicle screw fixation.65 mm lordotic rods were placed and locked down in situ and the posterolateral region was packed with the remaining BMP and  bone autograft on the left (35 cc). Incision was infiltrated 20 cc of long-acting Marcaine was used in the posterolateral soft tissue. A medium Hemovac drain was placed in the epidural space.  Fascia was closed with 1 Vicryl sutures skin edges were reapproximated 2 and 3-0 Vicryl sutures. The wound is dressed with Dermabond and an  occlusive dressing.   the patient was extubated in the operating room and taken to recovery in stable satisfactory condition he tolerated traction well counts were correct at the end of the case.   PLAN OF CARE: Admit to inpatient   PATIENT DISPOSITION:  PACU - hemodynamically stable.   Delay start of Pharmacological VTE agent (>24hrs) due to surgical blood loss or risk of bleeding: yes

## 2020-05-27 NOTE — Transfer of Care (Signed)
Immediate Anesthesia Transfer of Care Note  Patient: Ashlee Leblanc  Procedure(s) Performed: Right Lumbar Three-Four Lumbar Four-Five Transforaminal lumbar interbody fusion with Redo Decompression of Right Lumbar Three-Four (Right Spine Lumbar)  Patient Location: PACU  Anesthesia Type:General  Level of Consciousness: drowsy and patient cooperative  Airway & Oxygen Therapy: Patient Spontanous Breathing and Patient connected to face mask oxygen  Post-op Assessment: Report given to RN and Post -op Vital signs reviewed and stable  Post vital signs: Reviewed and stable  Last Vitals:  Vitals Value Taken Time  BP 95/48 05/27/20 1200  Temp 37 C 05/27/20 1200  Pulse 96 05/27/20 1211  Resp 23 05/27/20 1211  SpO2 97 % 05/27/20 1211  Vitals shown include unvalidated device data.  Last Pain:  Vitals:   05/27/20 1200  TempSrc:   PainSc: 3          Complications: No complications documented.

## 2020-05-27 NOTE — Anesthesia Procedure Notes (Signed)
Procedure Name: Intubation Date/Time: 05/27/2020 7:45 AM Performed by: Lance Coon, CRNA Pre-anesthesia Checklist: Patient identified, Emergency Drugs available, Suction available, Patient being monitored and Timeout performed Patient Re-evaluated:Patient Re-evaluated prior to induction Oxygen Delivery Method: Circle system utilized Preoxygenation: Pre-oxygenation with 100% oxygen Induction Type: IV induction Ventilation: Mask ventilation without difficulty Laryngoscope Size: Miller and 2 Grade View: Grade I Tube type: Oral Tube size: 7.0 mm Number of attempts: 1 Airway Equipment and Method: Stylet Placement Confirmation: ETT inserted through vocal cords under direct vision,  positive ETCO2 and breath sounds checked- equal and bilateral Secured at: 20 cm Tube secured with: Tape Dental Injury: Teeth and Oropharynx as per pre-operative assessment

## 2020-05-27 NOTE — Progress Notes (Signed)
Pharmacy Antibiotic Note  Ashlee Leblanc is a 69 y.o. female admitted on 05/27/2020 with surgical prophylaxis.  Pharmacy has been consulted for vancomycin dosing.  Patient with drain in. Planned discharge tomorrow. ClCr 84 ml/min.   Plan: Vancomycin 1g Q12 hr until drain removed Monitor clinical status, renal fx  Height: 5\' 6"  (167.6 cm) Weight: 109 kg (240 lb 4.8 oz) IBW/kg (Calculated) : 59.3  Temp (24hrs), Avg:98.6 F (37 C), Min:98.2 F (36.8 C), Max:98.9 F (37.2 C)  Recent Labs  Lab 05/24/20 1400  WBC 6.9  CREATININE 0.74    Estimated Creatinine Clearance: 84.2 mL/min (by C-G formula based on SCr of 0.74 mg/dL).    Allergies  Allergen Reactions  . Benadryl [Diphenhydramine] Other (See Comments)    Patient calls it the jerks.  . Darvon [Propoxyphene] Other (See Comments)    (Syncope) Patient passed out  . Morphine And Related Hives  . Tetracyclines & Related Other (See Comments)    Bad chest pain and burning    Antimicrobials this admission: Vanc  12/16 >>   Microbiology results: None   Thank you for allowing pharmacy to be a part of this patient's care.  Benetta Spar, PharmD, BCPS, BCCP Clinical Pharmacist  Please check AMION for all [facility] phone numbers After 10:00 PM, call [facility] [phone]

## 2020-05-27 NOTE — Progress Notes (Signed)
Orthopedic Tech Progress Note Patient Details:  Ashlee Leblanc 1951/06/06 [id number] Patient has brace Patient ID: Ashlee Leblanc, female   DOB: March 28, 1951, 69 y.o.   MRN: [id number]   Ashlee Leblanc 05/27/2020, 2:24 PM

## 2020-05-27 NOTE — Brief Op Note (Signed)
05/27/2020  12:03 PM  PATIENT:  Ashlee Leblanc  69 y.o. female  PRE-OPERATIVE DIAGNOSIS:  Foraminal stenosis of lumbar region, scoliosis, recurrent disc herniation, lumbago, radiculopathy L 34, L 45 levels  POST-OPERATIVE DIAGNOSIS:   Foraminal stenosis of lumbar region, scoliosis, recurrent disc herniation, lumbago, radiculopathy L 34, L 45 levels  PROCEDURE:  Procedure(s): Right Lumbar Three-Four Lumbar Four-Five Transforaminal lumbar interbody fusion with Redo Decompression of Right Lumbar Three-Four (Right) with expandable titanium cages, autograft, pedicle screw fixation, posterolateral arthrodesis  SURGEON:  Surgeon(s) and Role:    Erline Levine, MD - Primary    * Dawley, Theodoro Doing, DO - Assisting  PHYSICIAN ASSISTANT:   ASSISTANTS: Poteat, RN   ANESTHESIA:   general  EBL:  400 mL   BLOOD ADMINISTERED:190 CC CELLSAVER  DRAINS: (Medium) Hemovact drain(s) in the epidural space with  Suction Open   LOCAL MEDICATIONS USED:  MARCAINE    and LIDOCAINE   SPECIMEN:  No Specimen  DISPOSITION OF SPECIMEN:  N/A  COUNTS:  YES  TOURNIQUET:  * No tourniquets in log *  DICTATION: Patient is 69 year old woman with  HNP, spondylosis, scoliosis,  stenosis, DDD, radiculopathy L 34 and L 45 levels. She has a severe right leg pain and weakness. It was elected to take her to surgery for redo decompression and fusion at L 34 and L 45 levels.    Procedure: Patient was placed in a prone position on the [place] table after smooth and uncomplicated induction of general endotracheal anesthesia. Her low back was prepped and draped in usual sterile fashion with betadine scrub and DuraPrep after marking relevant bony anatomy with C arm. Area of incision was infiltrated with local lidocaine. Incision was made to the lumbodorsal fascia was incised and exposure was performed of the L 34 - L 45 spinous processes laminae facet joint and transverse processes. Intraoperative x-ray was obtained which  confirmed correct orientation. A total right hemi-laminectomy of L 34 and L 45 was performed with disarticulation of the facet joints at this level and thorough decompression was performed of both L 3, L 4,  L 5 nerve roots along with the common dural tube. There was densely adherent spondylytic material compressing the thecal sac and both L4 and L5 nerve roots.  A thorough redo discectomy and preparation of the endplates was performed at the L 34 level.  We used the operating microscope to insure complete decompression of the right L 4 nerve root and perform the right L 34 redo microdiscectomy.  This included wide foraminotomy along the L 4 nerve root with decompression within the axilla. We also performed wide decompression at the L 45 level on the right.  Bone was saved for later bone grafting.   The interspaces were packed with extra small BMP,and expandable titanium TLX cages ( 7 x 11 x 26 mm x 20 degrees at L 34 and 8 x 11 x 26 mm 20 degree at L 45).   5 cc of autograft was placed in each interspace prior to cage placement. The posterolateral region was extensively decorticated and pedicle probes were placed at L 3, L 4  and L 5 bilaterally. Intraoperative fluoroscopy confirmed correct orientationin the AP and lateral plane. 50 x 6.5 mm pedicle screws were placed at left L 3, L 4, L 5 levels as well as right L 3.  6.5 x 45 mm screws were placed on the right at L 4 and L 5. Final x-rays demonstrated well-positioned interbody grafts and  pedicle screw fixation.65 mm lordotic rods were placed and locked down in situ and the posterolateral region was packed with the remaining BMP and  bone autograft on the left (35 cc). Incision was infiltrated 20 cc of long-acting Marcaine was used in the posterolateral soft tissue. A medium Hemovac drain was placed in the epidural space.  Fascia was closed with 1 Vicryl sutures skin edges were reapproximated 2 and 3-0 Vicryl sutures. The wound is dressed with Dermabond and an  occlusive dressing.   the patient was extubated in the operating room and taken to recovery in stable satisfactory condition he tolerated traction well counts were correct at the end of the case.   PLAN OF CARE: Admit to inpatient   PATIENT DISPOSITION:  PACU - hemodynamically stable.   Delay start of Pharmacological VTE agent (>24hrs) due to surgical blood loss or risk of bleeding: yes

## 2020-05-27 NOTE — Anesthesia Postprocedure Evaluation (Signed)
Anesthesia Post Note  Patient: Ashlee Leblanc  Procedure(s) Performed: Right Lumbar Three-Four Lumbar Four-Five Transforaminal lumbar interbody fusion with Redo Decompression of Right Lumbar Three-Four (Right Spine Lumbar)     Patient location during evaluation: PACU Anesthesia Type: General Level of consciousness: sedated Pain management: pain level controlled Vital Signs Assessment: post-procedure vital signs reviewed and stable Respiratory status: spontaneous breathing and respiratory function stable Cardiovascular status: stable Postop Assessment: no apparent nausea or vomiting Anesthetic complications: no   No complications documented.  Last Vitals:  Vitals:   05/27/20 1330 05/27/20 1345  BP: 112/60 (!) 112/56  Pulse: 98 100  Resp: (!) 23 (!) 21  Temp:    SpO2: 94% 94%    Last Pain:  Vitals:   05/27/20 1330  TempSrc:   PainSc: 0-No pain                 [name] DANIEL

## 2020-05-28 LAB — GLUCOSE, CAPILLARY: Glucose-Capillary: 131 mg/dL — ABNORMAL HIGH (ref 70–99)

## 2020-05-28 MED ORDER — METHOCARBAMOL 500 MG PO TABS: 500.0000 mg | ORAL_TABLET | Freq: Four times a day (QID) | ORAL | 1 refills | Status: DC | PRN

## 2020-05-28 MED ORDER — HYDROCODONE-ACETAMINOPHEN 5-325 MG PO TABS: 1.0000 | ORAL_TABLET | ORAL | 0 refills | Status: DC | PRN

## 2020-05-28 MED FILL — Heparin Sodium (Porcine) Inj 1000 Unit/ML: INTRAMUSCULAR | Qty: 30 | Status: AC

## 2020-05-28 MED FILL — Sodium Chloride IV Soln 0.9%: INTRAVENOUS | Qty: 1000 | Status: AC

## 2020-05-28 NOTE — Evaluation (Signed)
Physical Therapy Evaluation Patient Details Name: Ashlee Leblanc MRN: [id number] DOB: 1950/11/17 Today's Date: 05/28/2020   History of Present Illness  69 y.o. female with PMH of HTN, HLD, DM, hiatal hernia, cancer, GERD. Pt with severe RLE pain and weakness, found to have a sequestered fragment of herniated disc material on the right at L3-4 level. Pt underwent R L4-3 microdiscectomy and posterior resection of synovial cyst on 04/02/2020.  Clinical Impression  Pt presents to PT with deficits in gait, balance, endurance, and power. Pt reporting some wooziness which she believes is due to pain medication. Pt utilizing UE support of cane as well as railing when available when ambulating at this time, PT recommends use of RW at home if feeling woozy with use of pain medicine to improve stability. Pt will continue to benefit from acute PT POC during this admission to aide in a return to independent mobility. Pt has no follow-up PT or DME needs at this time.    Follow Up Recommendations No PT follow up;Supervision - Intermittent    Equipment Recommendations  None recommended by PT (pt owns necessary DME)    Recommendations for Other Services       Precautions / Restrictions Precautions Precautions: Back Precaution Booklet Issued: Yes (comment) Precaution Comments: pt able to verbalize 3/3 back precautions Required Braces or Orthoses: Spinal Brace Spinal Brace: Lumbar corset;Applied in sitting position Restrictions Weight Bearing Restrictions: No      Mobility  Bed Mobility Overal bed mobility: Modified Independent             General bed mobility comments: pt log rolls and performs sidfelying to sit at a modI level    Transfers Overall transfer level: Modified independent Equipment used: Straight cane Transfers: Sit to/from Stand Sit to Stand: Modified independent (Device/Increase time)            Ambulation/Gait Ambulation/Gait assistance: Supervision Gait Distance  (Feet): 150 Feet Assistive device: Straight cane Gait Pattern/deviations: Step-to pattern Gait velocity: reduced Gait velocity interpretation: <1.8 ft/sec, indicate of risk for recurrent falls General Gait Details: pt with shortened step-to gait, increased lateral sway, holding onto railing on R side when available  Stairs Stairs:  (pt declines need for stair assessment, has ramp available)          Wheelchair Mobility    Modified Rankin (Stroke Patients Only)       Balance Overall balance assessment: Needs assistance Sitting-balance support: No upper extremity supported;Feet supported Sitting balance-Leahy Scale: Good     Standing balance support: Single extremity supported Standing balance-Leahy Scale: Poor Standing balance comment: reliant on UE support of cane                             Pertinent Vitals/Pain Pain Assessment: Faces Faces Pain Scale: Hurts little more Pain Location: back Pain Descriptors / Indicators: Grimacing Pain Intervention(s): Monitored during session    Home Living Family/patient expects to be discharged to:: Private residence Living Arrangements: Other relatives Available Help at Discharge: Family;Available 24 hours/day (niece is staying with the pt until she is recovered from this back surgery) Type of Home: House Home Access: Stairs to enter Entrance Stairs-Rails: None Entrance Stairs-Number of Steps: 1 Home Layout: One level Home Equipment: Walker - 2 wheels;Bedside commode;Shower seat;Cane - single point      Prior Function Level of Independence: Independent with assistive device(s)         Comments: pt ambulating with cane recently, still  recovering from prior back surgery     Hand Dominance   Dominant Hand: Right    Extremity/Trunk Assessment   Upper Extremity Assessment Upper Extremity Assessment: Overall WFL for tasks assessed    Lower Extremity Assessment Lower Extremity Assessment: Overall WFL for  tasks assessed    Cervical / Trunk Assessment Cervical / Trunk Assessment: Other exceptions Cervical / Trunk Exceptions: s/p spinal surgery, LSO donned  Communication   Communication: No difficulties  Cognition Arousal/Alertness: Awake/alert Behavior During Therapy: WFL for tasks assessed/performed Overall Cognitive Status: Within Functional Limits for tasks assessed                                        General Comments General comments (skin integrity, edema, etc.): VSS on RA, pt reports some wooziness which she believes is related to medication    Exercises     Assessment/Plan    PT Assessment Patient needs continued PT services  PT Problem List Decreased balance;Decreased mobility       PT Treatment Interventions DME instruction;Gait training;Stair training;Functional mobility training;Therapeutic activities;Therapeutic exercise;Balance training;Neuromuscular re-education;Patient/family education    PT Goals (Current goals can be found in the Care Plan section)  Acute Rehab PT Goals Patient Stated Goal: to go home PT Goal Formulation: With patient Time For Goal Achievement: 06/11/20 Potential to Achieve Goals: Good    Frequency Min 5X/week   Barriers to discharge        Co-evaluation               AM-PAC PT "6 Clicks" Mobility  Outcome Measure Help needed turning from your back to your side while in a flat bed without using bedrails?: None Help needed moving from lying on your back to sitting on the side of a flat bed without using bedrails?: None Help needed moving to and from a bed to a chair (including a wheelchair)?: None Help needed standing up from a chair using your arms (e.g., wheelchair or bedside chair)?: None Help needed to walk in hospital room?: A Little Help needed climbing 3-5 steps with a railing? : A Little 6 Click Score: 22    End of Session Equipment Utilized During Treatment: Back brace Activity Tolerance: Patient  tolerated treatment well Patient left: in bed;with call bell/phone within reach Nurse Communication: Mobility status PT Visit Diagnosis: Other abnormalities of gait and mobility (R26.89)    Time: 9983-3825 PT Time Calculation (min) (ACUTE ONLY): 15 min   Charges:   PT Evaluation $PT Eval Low Complexity: [place], PT, DPT Acute Rehabilitation Pager: [phone]   Zenaida Niece 05/28/2020, 9:54 AM

## 2020-05-28 NOTE — Discharge Summary (Signed)
Physician Discharge Summary  Patient ID: Ashlee Leblanc MRN: [id number] DOB/AGE: [birth year] 69 y.o.  Admit date: 05/27/2020 Discharge date: 05/28/2020  Admission Diagnoses:Foraminal stenosis of lumbar region, scoliosis, recurrent disc herniation, lumbago, radiculopathy L 34, L 45 levels    Discharge Diagnoses: Foraminal stenosis of lumbar region, scoliosis, recurrent disc herniation, lumbago, radiculopathy L 34, L 45 levels s/p Right Lumbar Three-Four Lumbar Four-Five Transforaminal lumbar interbody fusion with Redo Decompression of Right Lumbar Three-Four (Right) with expandable titanium cages, autograft, pedicle screw fixation, posterolateral arthrodesis     Active Problems:   Lumbar scoliosis   Discharged Condition: good  Hospital Course: Ashlee Leblanc was admitted for surgery with dx foraminal stenosis, scoliosis, and HNP. Following uncomplicated surgery (above), she recovered nicely and transferred to [facility] for nursing care and therapies. She is progressing well with resolution of pain.  Consults: None  Significant Diagnostic Studies: radiology: X-Ray: intra-op  Treatments: surgery: Right Lumbar Three-Four Lumbar Four-Five Transforaminal lumbar interbody fusion with Redo Decompression of Right Lumbar Three-Four (Right) with expandable titanium cages, autograft, pedicle screw fixation, posterolateral arthrodesis    Discharge Exam: Blood pressure (!) 121/51, pulse 99, temperature 98.9 F (37.2 C), temperature source Oral, resp. rate 20, height 5\' 6"  (1.676 m), weight 109 kg, SpO2 96 %. Alert, conversant, reporting only lumbar "discomfort". Reports no RLE pain since surgery. Good strength BLE. Denies numbness/tingling. Incision flat without erythema, swelling or drainage beneath honeycomb and Dermabond.    Disposition:  Discharge to home.  She verbalizes understanding of d/c instructions and agrees to call office to schedule f/u appt. Norco 5/325 and Robaxin  500mg  will be eRx;'ed for prn home use.   Allergies as of 05/28/2020      Reactions   Benadryl [diphenhydramine] Other (See Comments)   Patient calls it the jerks.   Darvon [propoxyphene] Other (See Comments)   (Syncope) Patient passed out   Morphine And Related Hives   Tetracyclines & Related Other (See Comments)   Bad chest pain and burning      Medication List    TAKE these medications   BRAIN PO Take 1 tablet by mouth in the morning and at bedtime. Cognium Brain Health Tablets   CALCIUM 600+D PO Take 1 tablet by mouth daily.   citalopram 20 MG tablet Commonly known as: CELEXA Take 20 mg by mouth daily.   enalapril 10 MG tablet Commonly known as: VASOTEC Take 10 mg by mouth daily.   glipiZIDE 5 MG 24 hr tablet Commonly known as: GLUCOTROL XL Take 5 mg by mouth 2 (two) times daily.   hydrochlorothiazide 12.5 MG capsule Commonly known as: MICROZIDE Take 12.5 mg by mouth daily.   HYDROcodone-acetaminophen 5-325 MG tablet Commonly known as: NORCO/VICODIN Take 1 tablet by mouth every 4 (four) hours as needed (pain). What changed: how much to take   HYDROcodone-acetaminophen 5-325 MG tablet Commonly known as: NORCO/VICODIN Take 1-2 tablets by mouth every 4 (four) hours as needed for moderate pain. What changed: You were already taking a medication with the same name, and this prescription was added. Make sure you understand how and when to take each.   hyoscyamine 0.375 MG 12 hr tablet Commonly known as: LEVBID TAKE 1 TABLET (0.375 MG TOTAL) BY MOUTH DAILY BEFORE BREAKFAST. (NOT COVERED. DR DID NOT CHANGE) What changed: See the new instructions.   MEGARED OMEGA-3 KRILL OIL PO Take 1 capsule by mouth daily.   metFORMIN 1000 MG tablet Commonly known as: GLUCOPHAGE Take 1,000 mg by mouth 2 (two) times  daily with a meal.   methocarbamol 500 MG tablet Commonly known as: ROBAXIN Take 1 tablet (500 mg total) by mouth every 6 (six) hours as needed for muscle  spasms. What changed: Another medication with the same name was added. Make sure you understand how and when to take each.   methocarbamol 500 MG tablet Commonly known as: ROBAXIN Take 1 tablet (500 mg total) by mouth every 6 (six) hours as needed for muscle spasms. What changed: You were already taking a medication with the same name, and this prescription was added. Make sure you understand how and when to take each.   mometasone 50 MCG/ACT nasal spray Commonly known as: NASONEX Place 2 sprays into the nose daily as needed (allergies.).   montelukast 10 MG tablet Commonly known as: SINGULAIR Take 10 mg by mouth at bedtime.   MOVE FREE PO Take 1 tablet by mouth daily. Move Freely   multivitamin with minerals Tabs tablet Take 1 tablet by mouth daily.   nystatin-triamcinolone cream Commonly known as: MYCOLOG II Apply 1 application topically 3 (three) times daily as needed (rash).   omeprazole 20 MG capsule Commonly known as: PRILOSEC Take 20 mg by mouth daily as needed (acid reflux/indigestion.).   pioglitazone 45 MG tablet Commonly known as: ACTOS Take 45 mg by mouth daily.   PROBIOTIC PO Take 1 capsule by mouth daily.   simvastatin 40 MG tablet Commonly known as: ZOCOR Take 40 mg by mouth every evening.   vitamin C with rose hips 500 MG tablet Take 500 mg by mouth daily.   Vitamin D-3 125 MCG (5000 UT) Tabs Take 5,000 Units by mouth at bedtime.   vitamin E 180 MG (400 UNITS) capsule Take 400 Units by mouth daily.        Signed: [name], MD 05/28/2020, 7:44 AM

## 2020-05-28 NOTE — Evaluation (Signed)
Occupational Therapy Evaluation Patient Details Name: Ashlee Leblanc MRN: [id number] DOB: 12-06-1950 Today's Date: 05/28/2020    History of Present Illness 69 y.o. female with PMH of HTN, HLD, DM, hiatal hernia, cancer, GERD. Pt with severe RLE pain and weakness, found to have a sequestered fragment of herniated disc material on the right at L3-4 level. Pt underwent R L4-3 microdiscectomy and posterior resection of synovial cyst on 04/02/2020.   Clinical Impression   Patient admitted with the above diagnosis and procedure.  Deficits are listed below, but essentially she is at or near her baseline for self care, functional mobility and toileting.  No further OT needs in the acute setting.  No OT recommended post acute.      Follow Up Recommendations  No OT follow up    Equipment Recommendations  None recommended by OT    Recommendations for Other Services       Precautions / Restrictions Precautions Precautions: Back Precaution Booklet Issued: Yes (comment) Precaution Comments: pt able to verbalize 3/3 back precautions Required Braces or Orthoses: Spinal Brace Spinal Brace: Lumbar corset;Applied in sitting position Restrictions Weight Bearing Restrictions: No      Mobility Bed Mobility Overal bed mobility: Modified Independent             General bed mobility comments: pt log rolls and performs sidfelying to sit at a modI level    Transfers Overall transfer level: Modified independent Equipment used: Straight cane Transfers: Sit to/from Stand Sit to Stand: Modified independent (Device/Increase time)              Balance Overall balance assessment: Mild deficits observed, not formally tested Sitting-balance support: No upper extremity supported;Feet supported Sitting balance-Leahy Scale: Good     Standing balance support: Single extremity supported Standing balance-Leahy Scale: Poor Standing balance comment: reliant on UE support of cane                            ADL either performed or assessed with clinical judgement   ADL Overall ADL's : At baseline                                     Functional mobility during ADLs: Modified independent General ADL Comments: Educated on compensatory technique and use of AE and DEM for ADL. Pt able to return demonstrate with use of reacher, sock aid and reacher from prior education.  Continued educated on home safety and set up to increase aderence to precautions.     Vision Patient Visual Report: No change from baseline       Perception     Praxis      Pertinent Vitals/Pain Pain Assessment: Faces Faces Pain Scale: Hurts a little bit Pain Location: back Pain Descriptors / Indicators: Grimacing Pain Intervention(s): Monitored during session     Hand Dominance Right   Extremity/Trunk Assessment Upper Extremity Assessment Upper Extremity Assessment: Overall WFL for tasks assessed   Lower Extremity Assessment Lower Extremity Assessment: Defer to PT evaluation   Cervical / Trunk Assessment Cervical / Trunk Assessment: Other exceptions Cervical / Trunk Exceptions: s/p spinal surgery, LSO donned   Communication Communication Communication: No difficulties   Cognition Arousal/Alertness: Awake/alert Behavior During Therapy: WFL for tasks assessed/performed Overall Cognitive Status: Within Functional Limits for tasks assessed  General Comments  VSS                Home Living Family/patient expects to be discharged to:: Private residence Living Arrangements: Other relatives Available Help at Discharge: Family;Available 24 hours/day Type of Home: House Home Access: Stairs to enter [employer] of Steps: 1 Entrance Stairs-Rails: None Home Layout: One level     Bathroom Shower/Tub: Tub/shower unit;Walk-in shower   Bathroom Toilet: Standard Bathroom Accessibility: Yes How Accessible:  Accessible via walker Home Equipment: [place] - 2 wheels;Bedside commode;Shower seat;Cane - single point          Prior Functioning/Environment Level of Independence: Independent with assistive device(s)        Comments: pt ambulating with cane recently, still recovering from prior back surgery        OT Problem List: Pain      OT Treatment/Interventions:      OT Goals(Current goals can be found in the care plan section) Acute Rehab OT Goals Patient Stated Goal: Return home and walk more OT Goal Formulation: With patient Time For Goal Achievement: 05/28/20 Potential to Achieve Goals: Good  OT Frequency:     Barriers to D/C:  none noted          Co-evaluation              AM-PAC OT "6 Clicks" Daily Activity     Outcome Measure Help from another person eating meals?: None Help from another person taking care of personal grooming?: None Help from another person toileting, which includes using toliet, bedpan, or urinal?: None Help from another person bathing (including washing, rinsing, drying)?: None Help from another person to put on and taking off regular upper body clothing?: None Help from another person to put on and taking off regular lower body clothing?: None 6 Click Score: 24   End of Session Equipment Utilized During Treatment: Back brace Nurse Communication: Mobility status;Other (comment)  Activity Tolerance: Patient tolerated treatment well Patient left: in bed;with call bell/phone within reach  OT Visit Diagnosis: Unsteadiness on feet (R26.81);Pain                Time: 0973-5329 OT Time Calculation (min): 23 min Charges:  OT General Charges $OT Visit: 1 Visit OT Evaluation $OT Eval Moderate Complexity: 1 Mod OT Treatments $Self Care/Home Management : 8-22 mins  05/28/2020  Rich, OTR/L  Acute Rehabilitation Services  Office:  [place] 05/28/2020, 11:10 AM

## 2020-05-28 NOTE — Discharge Instructions (Signed)
Wound Care Leave incision open to air. You may shower. Do not scrub directly on incision.  Do not put any creams, lotions, or ointments on incision. Activity Walk each and every day, increasing distance each day. No lifting greater than 5 lbs.  Avoid bending, arching, and twisting. No driving for 2 weeks; may ride as a passenger locally. If provided with back brace, wear when out of bed.  It is not necessary to wear in bed. Diet Resume your normal diet.  Return to Work Will be discussed at you follow up appointment. Call Your Doctor If Any of These Occur Redness, drainage, or swelling at the wound.  Temperature greater than 101 degrees. Severe pain not relieved by pain medication. Incision starts to come apart. Follow Up Appt Call today for appointment in 2-3 weeks ([phone]) or for problems.  If you have any hardware placed in your spine, you will need an x-ray before your appointment. 

## 2020-05-28 NOTE — Progress Notes (Addendum)
Subjective: Patient reports "I'm just a little sore in my low back, but no pain; and my right leg pain is gone."  Objective: Vital signs in last 24 hours: Temp:  [98.2 F (36.8 C)-98.9 F (37.2 C)] 98.9 F (37.2 C) (12/17 0446) Pulse Rate:  [88-102] 99 (12/17 0446) Resp:  [13-23] 20 (12/17 0446) BP: (90-121)/(36-64) 121/51 (12/17 0446) SpO2:  [94 %-100 %] 96 % (12/17 0446)  Intake/Output from previous day: 12/16 0701 - 12/17 0700 In: 2840 [I.V.:2150; Blood:190; IV Piggyback:500] Out: 975 [Urine:335; Drains:240; Blood:400] Intake/Output this shift: No intake/output data recorded.  Alert, conversant, reporting only lumbar "discomfort". Reports no RLE pain since surgery. Good strength BLE. Denies numbness/tingling. Incision flat without erythema, swelling or drainage beneath honeycomb and Dermabond.  Lab Results: No results for input(s): WBC, HGB, HCT, PLT in the last 72 hours. BMET No results for input(s): NA, K, CL, CO2, GLUCOSE, BUN, CREATININE, CALCIUM in the last 72 hours.  Studies/Results: DG Lumbar Spine 2-3 Views  Result Date: 05/27/2020 CLINICAL DATA:  L3-L4-L5 PLIF EXAM: LUMBAR SPINE - 2-3 VIEW; DG C-ARM 1-60 MIN COMPARISON:  04/02/2020 FINDINGS: AP and lateral C-arm images were obtained in the operating room. Bilateral pedicle screws at L3, L4, L5. Interbody spacers at L3-4 L4-5. Hardware in satisfactory position. IMPRESSION: PLIF L3-4 L4-5. Electronically Signed   By: Franchot Gallo M.D.   On: 05/27/2020 11:32   DG C-Arm 1-60 Min  Result Date: 05/27/2020 CLINICAL DATA:  L3-L4-L5 PLIF EXAM: LUMBAR SPINE - 2-3 VIEW; DG C-ARM 1-60 MIN COMPARISON:  04/02/2020 FINDINGS: AP and lateral C-arm images were obtained in the operating room. Bilateral pedicle screws at L3, L4, L5. Interbody spacers at L3-4 L4-5. Hardware in satisfactory position. IMPRESSION: PLIF L3-4 L4-5. Electronically Signed   By: Franchot Gallo M.D.   On: 05/27/2020 11:32    Assessment/Plan: improving  LOS:  1 day  Work with PT this am; d/c to home when cleared by PT. She verbalizes understanding of d/c instructions and agrees to call office to schedule f/u appt. Norco 5/325 and Robaxin 500mg  will be eRx;'ed for prn home use.    Verdis Prime 05/28/2020, 7:24 AM   Patient is doing well.  Discharge home.

## 2020-05-28 NOTE — Progress Notes (Signed)
Patient alert and oriented, mae's well, voiding adequate amount of urine, swallowing without difficulty, no c/o pain at time of discharge. Patient discharged home with family. Script and discharged instructions given to patient. Patient and family stated understanding of instructions given. Patient has an appointment with Dr.Stern    

## 2020-05-31 ENCOUNTER — Encounter (HOSPITAL_COMMUNITY): Payer: Self-pay | Admitting: Neurosurgery

## 2020-07-23 ENCOUNTER — Other Ambulatory Visit (INDEPENDENT_AMBULATORY_CARE_PROVIDER_SITE_OTHER): Payer: Self-pay | Admitting: Internal Medicine

## 2020-07-23 NOTE — Telephone Encounter (Signed)
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 Laural Golden. Has an appt 02/31/2022 with Dr. Jenetta Downer

## 2020-09-09 ENCOUNTER — Ambulatory Visit (INDEPENDENT_AMBULATORY_CARE_PROVIDER_SITE_OTHER): Payer: Medicare Other | Admitting: Gastroenterology

## 2020-09-09 ENCOUNTER — Encounter (INDEPENDENT_AMBULATORY_CARE_PROVIDER_SITE_OTHER): Payer: Self-pay | Admitting: Gastroenterology

## 2020-09-09 ENCOUNTER — Other Ambulatory Visit: Payer: Self-pay

## 2020-09-09 VITALS — BP 138/83 | HR 103 | Temp 98.5°F | Ht 66.0 in | Wt 217.0 lb

## 2020-09-09 DIAGNOSIS — K580 Irritable bowel syndrome with diarrhea: Secondary | ICD-10-CM

## 2020-09-09 NOTE — Patient Instructions (Signed)
Can restart taking the Levbid if abdominal pain or diarrhea worsen

## 2020-09-09 NOTE — Progress Notes (Signed)
Maylon Peppers, M.D. Gastroenterology & Hepatology [facility] [address] [place], [place] [address]  Primary Care Physician: Thea Alken [address] [place] [address]  I will communicate my assessment and recommendations to the referring MD via EMR.  Problems: 1. IBS-D  History of Present Illness: Ashlee Leblanc is a 70 y.o. female with past medical history of GERD, diabetes, hiatal hernia, hyperlipidemia, hypertension, who presents for follow up of IBS-D.  The patient was last seen on 02/05/2020. At that time, the patient was continued on Levbid for management of IBS-D.  Was also continued on omeprazole 20 mg every day for GERD.  She reports that at the most she does have one episode of abdominal pain described as cramping once a week. She reports the pain lasts for an hour and goes away on its own. Has not been taking the Levbid as she did not want it to interfere with her physicial therapy but she has finished her rehab.  She required to be in intensive rehab program after she had a prolonged hospitalization due to UTI sepsis in December.  She stopped the medication as she did not want to have problems with her bowel movements while exercising.currently, she is having two meals a day. Has 1-2 BMs per day. The patient denies having any nausea, vomiting, fever, chills, hematochezia, melena, hematemesis, abdominal distention, diarrhea, jaundice, pruritus or weight loss.  The patient states that she would like to avoid taking the medication for now and see how she does without taking it.  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 Family history of colon cancer -brother in his 49s.  Patient had negative celiac serologies on 07/17/2019.  Notably, she did not tolerate intake of cholestyramine in the past and did not improve with intake of Xifaxan for 2 weeks.  Last EGD: Last Colonoscopy: 09/04/2019,  normal terminal ileum, diverticulosis of the colon, external hemorrhoids.  Random colonic biopsies were negative for microscopic colitis.  Past Medical History: Past Medical History:  Diagnosis Date  . Arthritis   . 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  . Claustrophobia   . Complication of anesthesia    " hard time Knocking me out." "I wake up before I leave the OR."  . Diabetes ([place])    Type II  . GERD (gastroesophageal reflux disease)   . History of hiatal hernia   . HLD (hyperlipidemia)   . HTN (hypertension)   . Sleep apnea 2001   can't wear mask - claystrophbic    Past Surgical History: Past Surgical History:  Procedure Laterality Date  . BACK SURGERY    . BIOPSY  09/04/2019   Procedure: BIOPSY;  Surgeon: Rogene Houston, MD;  [facility];  Service: Endoscopy;;  random colon  . CHOLECYSTECTOMY  2015  . COLONOSCOPY N/A 09/04/2019   Procedure: COLONOSCOPY;  Surgeon: Rogene Houston, MD;  [facility];  Service: Endoscopy;  Laterality: N/A;  1250  . LUMBAR LAMINECTOMY/DECOMPRESSION MICRODISCECTOMY Right 04/02/2020   Procedure: Right Lumbar Three-Four Microdiscectomy;  Surgeon: Erline Levine, MD;  Location: [facility];  Service: Neurosurgery;  Laterality: Right;  posterior  . Neuromoa Left    foot  . PLANTAR FASCIA SURGERY Bilateral   . REPLACEMENT TOTAL KNEE BILATERAL    . ROTATOR CUFF REPAIR     bilateral at different dates  . TRANSFORAMINAL LUMBAR INTERBODY FUSION (TLIF) WITH PEDICLE SCREW FIXATION 2  LEVEL Right 05/27/2020   Procedure: Right Lumbar Three-Four Lumbar Four-Five Transforaminal lumbar interbody fusion with Redo Decompression of Right Lumbar Three-Four;  Surgeon: Erline Levine, MD;  Location: [facility];  Service: Neurosurgery;  Laterality: Right;    Family History: Family History  Problem Relation Age of Onset  . Colon cancer Brother     Social History: Social History   Tobacco Use  Smoking Status Former Smoker  . Years:  1.00  Smokeless Tobacco Never Used  Tobacco Comment   03/30/20- over 35 years ago   Social History   Substance and Sexual Activity  Alcohol Use Never   Social History   Substance and Sexual Activity  Drug Use Never    Allergies: Allergies  Allergen Reactions  . Benadryl [Diphenhydramine] Other (See Comments)    Patient calls it the jerks.  . Darvon [Propoxyphene] Other (See Comments)    (Syncope) Patient passed out  . Morphine And Related Hives  . Tetracyclines & Related Other (See Comments)    Bad chest pain and burning    Medications: Current Outpatient Medications  Medication Sig Dispense Refill  . Cholecalciferol (VITAMIN D-3) 125 MCG (5000 UT) TABS Take 5,000 Units by mouth at bedtime.     . enalapril (VASOTEC) 10 MG tablet Take 10 mg by mouth daily.    Marland Kitchen glipiZIDE (GLUCOTROL XL) 5 MG 24 hr tablet Take 5 mg by mouth 2 (two) times daily.    . metFORMIN (GLUCOPHAGE) 1000 MG tablet Take 1,000 mg by mouth 2 (two) times daily with a meal.    . mometasone (NASONEX) 50 MCG/ACT nasal spray Place 2 sprays into the nose daily as needed (allergies.).     Marland Kitchen montelukast (SINGULAIR) 10 MG tablet Take 10 mg by mouth at bedtime.    Marland Kitchen nystatin-triamcinolone (MYCOLOG II) cream Apply 1 application topically 3 (three) times daily as needed (rash).    . simvastatin (ZOCOR) 40 MG tablet Take 40 mg by mouth every evening.    Marland Kitchen Specialty Vitamins Products (BRAIN PO) Take 1 tablet by mouth in the morning and at bedtime. Cognium Brain Health Tablets    . Ascorbic Acid (VITAMIN C WITH ROSE HIPS) 500 MG tablet Take 500 mg by mouth daily. (Patient not taking: Reported on 09/09/2020)    . Calcium Carbonate-Vitamin D (CALCIUM 600+D PO) Take 1 tablet by mouth daily.  (Patient not taking: Reported on 09/09/2020)    . citalopram (CELEXA) 20 MG tablet Take 20 mg by mouth daily. (Patient not taking: Reported on 09/09/2020)    . Glucosamine-Chondroitin (MOVE FREE PO) Take 1 tablet by mouth daily. Move Freely  (Patient not taking: Reported on 09/09/2020)    . hydrochlorothiazide (MICROZIDE) 12.5 MG capsule Take 12.5 mg by mouth daily. (Patient not taking: Reported on 09/09/2020)    . HYDROcodone-acetaminophen (NORCO/VICODIN) 5-325 MG tablet Take 1-2 tablets by mouth every 4 (four) hours as needed for moderate pain. (Patient not taking: Reported on 09/09/2020) 30 tablet 0  . hyoscyamine (LEVBID) 0.375 MG 12 hr tablet Take 1 tablet (0.375 mg total) by mouth daily before breakfast. (Patient not taking: Reported on 09/09/2020) 90 tablet 3  . MEGARED OMEGA-3 KRILL OIL PO Take 1 capsule by mouth daily.  (Patient not taking: Reported on 09/09/2020)    . methocarbamol (ROBAXIN) 500 MG tablet Take 1 tablet (500 mg total) by mouth every 6 (six) hours as needed for muscle spasms. (Patient not taking: Reported on 09/09/2020) 30 tablet 0  . Multiple Vitamin (MULTIVITAMIN WITH MINERALS) TABS  tablet Take 1 tablet by mouth daily. (Patient not taking: Reported on 09/09/2020)    . omeprazole (PRILOSEC) 20 MG capsule Take 20 mg by mouth daily as needed (acid reflux/indigestion.). (Patient not taking: Reported on 09/09/2020)    . pioglitazone (ACTOS) 45 MG tablet Take 45 mg by mouth daily. (Patient not taking: Reported on 09/09/2020)    . Probiotic Product (PROBIOTIC PO) Take 1 capsule by mouth daily.  (Patient not taking: Reported on 09/09/2020)    . vitamin E 180 MG (400 UNITS) capsule Take 400 Units by mouth daily. (Patient not taking: Reported on 09/09/2020)     No current facility-administered medications for this visit.    Review of Systems: GENERAL: negative for malaise, night sweats HEENT: No changes in hearing or vision, no nose bleeds or other nasal problems. NECK: Negative for lumps, goiter, pain and significant neck swelling RESPIRATORY: Negative for cough, wheezing CARDIOVASCULAR: Negative for chest pain, leg swelling, palpitations, orthopnea GI: SEE HPI MUSCULOSKELETAL: Negative for joint pain or swelling, back pain,  and muscle pain. SKIN: Negative for lesions, rash PSYCH: Negative for sleep disturbance, mood disorder and recent psychosocial stressors. HEMATOLOGY Negative for prolonged bleeding, bruising easily, and swollen nodes. ENDOCRINE: Negative for cold or heat intolerance, polyuria, polydipsia and goiter. NEURO: negative for tremor, gait imbalance, syncope and seizures. The remainder of the review of systems is noncontributory.   Physical Exam: BP 138/83 (BP Location: Left Arm, Patient Position: Sitting, Cuff Size: Large)   Pulse (!) 103   Temp 98.5 F (36.9 C) (Oral)   Ht 5\' 6"  (1.676 m)   Wt 217 lb (98.4 kg) Comment: Weight per patient.  BMI 35.02 kg/m  GENERAL: The patient is AO x3, in no acute distress. HEENT: Head is normocephalic and atraumatic. EOMI are intact. Mouth is well hydrated and without lesions. NECK: Supple. No masses LUNGS: Clear to auscultation. No presence of rhonchi/wheezing/rales. Adequate chest expansion HEART: RRR, normal s1 and s2. ABDOMEN: Soft, nontender, no guarding, no peritoneal signs, and nondistended. BS +. No masses. EXTREMITIES: Without any cyanosis, clubbing, rash, lesions or edema. NEUROLOGIC: AOx3, no focal motor deficit. SKIN: no jaundice, no rashes  Imaging/Labs: as above  I personally reviewed and interpreted the available labs, imaging and endoscopic files.  Impression and Plan: Ashlee Leblanc is a 70 y.o. female with past medical history of GERD, diabetes, hiatal hernia, hyperlipidemia, hypertension, who presents for follow up of IBS-D.  The patient has presented relatively mild symptoms since the last time she was seen in clinic.  Has not presented any recurrent diarrhea after stopping Levbid and has presented occasional episodes abdominal pain.  I agree with her that it is reasonable for her to avoid taking the medications but I advised her that if she presents any recurrent diarrhea or abdominal pain that is severe she should restart taking it.   She has not presented any red flag signs that warrant any further investigations at this moment.  Patient understood and agreed.  - Patient to restart taking the Levbid if abdominal pain or diarrhea worsen - RTC 1 year   All questions were answered.      Harvel Quale, MD Gastroenterology and Hepatology [facility]

## 2021-02-03 ENCOUNTER — Ambulatory Visit (INDEPENDENT_AMBULATORY_CARE_PROVIDER_SITE_OTHER): Payer: Medicare Other | Admitting: Gastroenterology

## 2021-09-08 ENCOUNTER — Ambulatory Visit (INDEPENDENT_AMBULATORY_CARE_PROVIDER_SITE_OTHER): Payer: Medicare Other | Admitting: Gastroenterology

## 2021-12-18 IMAGING — CR DG LUMBAR SPINE 2-3V
3 series · 3 of 3 positions shown · non-contrast
Comparison: 03/19/2020, MRI 03/01/2020

CLINICAL DATA: Right L3-4 micro discectomy

EXAM:
LUMBAR SPINE - 2-3 VIEW

[lateral (1 of 3)]
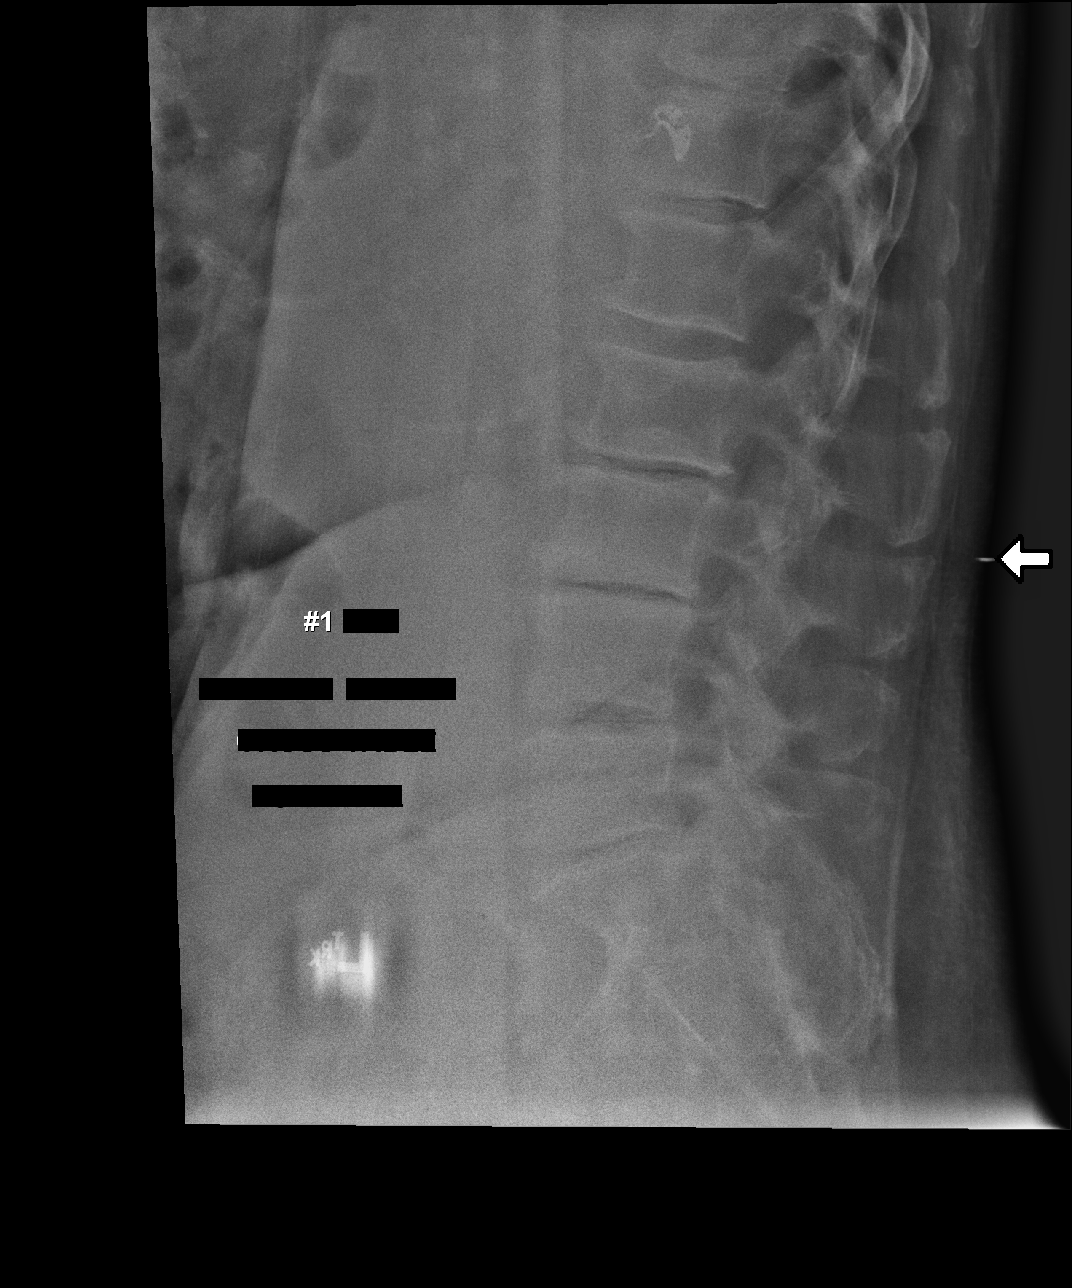

[lateral (2 of 3)]
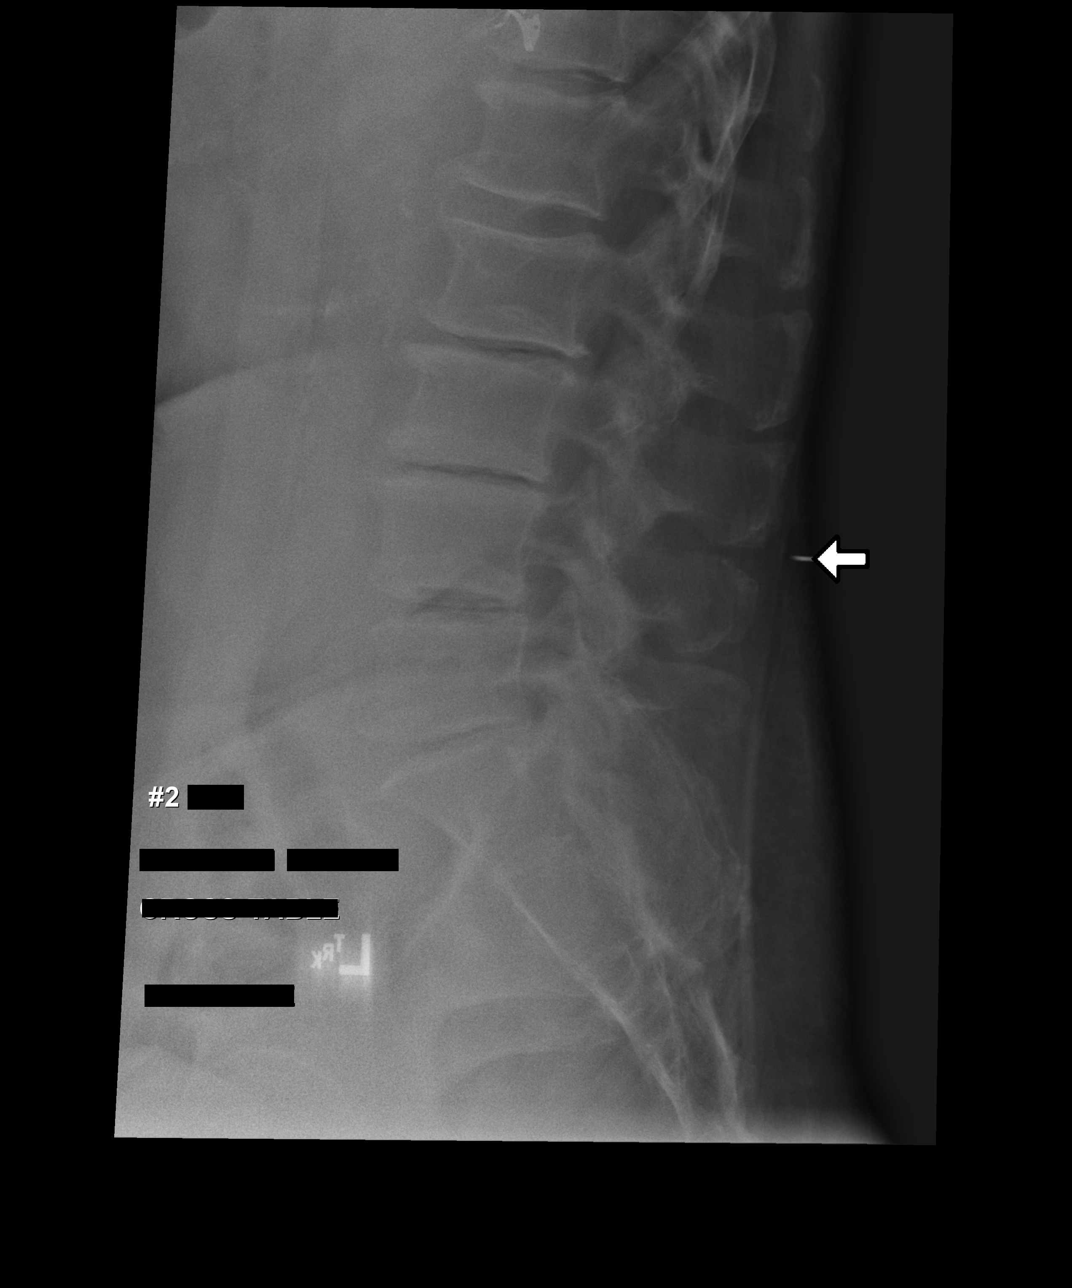

[lateral (3 of 3)]
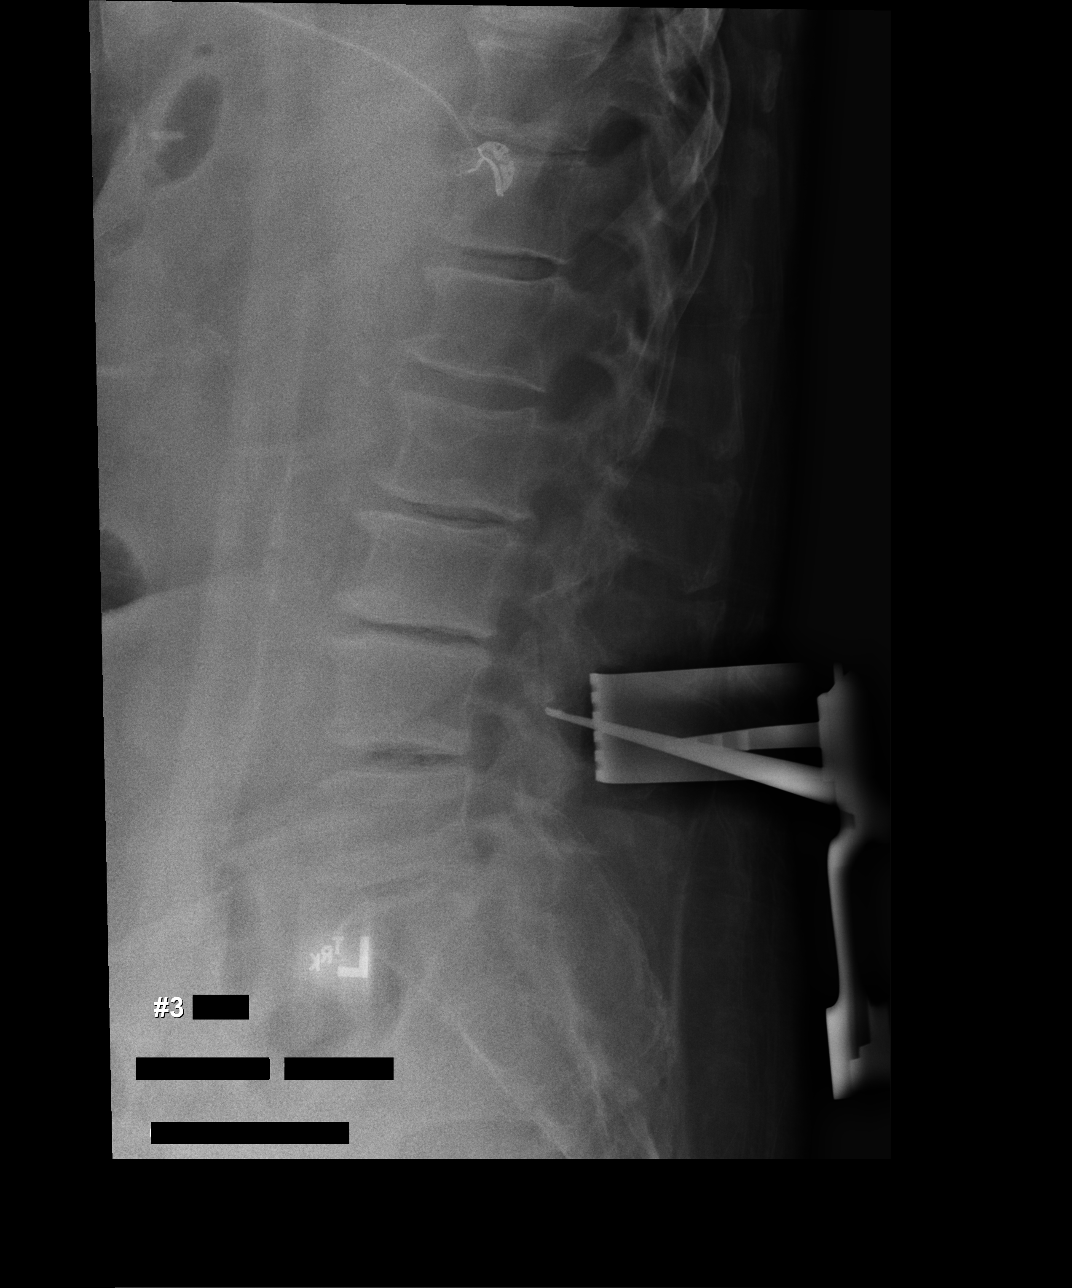

[3 of 3 positions shown; findings below may reference images not displayed]

FINDINGS: Limited intraoperative lateral views of the lumbar spine. Image
labeled #1 demonstrates partially visible localizing instrument at
the skin surface at the L3 level. Image labeled #2 demonstrates
linear localizing device over the skin surface between the spinous
processes of L3 and L4. Final image #3 demonstrates surgical
instruments posterior to L4 with linear localizing instrument
overlying the posterior elements at L4 level.
IMPRESSION: Limited intraoperative lateral views of the lumbar spine obtained
for localization purposes.

## 2023-02-01 ENCOUNTER — Emergency Department (HOSPITAL_COMMUNITY)
Admission: EM | Admit: 2023-02-01 | Discharge: 2023-02-02 | Disposition: A | Discharge status: Home / Self Care | Payer: Medicare Other | Attending: Emergency Medicine | Admitting: Emergency Medicine

## 2023-02-01 ENCOUNTER — Other Ambulatory Visit: Payer: Self-pay

## 2023-02-01 ENCOUNTER — Emergency Department (HOSPITAL_COMMUNITY): Payer: Medicare Other

## 2023-02-01 ENCOUNTER — Other Ambulatory Visit (INDEPENDENT_AMBULATORY_CARE_PROVIDER_SITE_OTHER): Payer: Self-pay | Admitting: Gastroenterology

## 2023-02-01 ENCOUNTER — Encounter (INDEPENDENT_AMBULATORY_CARE_PROVIDER_SITE_OTHER): Payer: Self-pay | Admitting: Gastroenterology

## 2023-02-01 ENCOUNTER — Ambulatory Visit (INDEPENDENT_AMBULATORY_CARE_PROVIDER_SITE_OTHER): Payer: Medicare Other | Admitting: Gastroenterology

## 2023-02-01 ENCOUNTER — Ambulatory Visit (HOSPITAL_COMMUNITY)
Admission: RE | Admit: 2023-02-01 | Discharge: 2023-02-01 | Disposition: A | Discharge status: Home / Self Care | Payer: Medicare Other | Source: Ambulatory Visit | Attending: Gastroenterology | Admitting: Gastroenterology

## 2023-02-01 VITALS — BP 153/84 | HR 89 | Temp 97.9°F | Ht 66.0 in | Wt 247.0 lb

## 2023-02-01 DIAGNOSIS — R110 Nausea: Secondary | ICD-10-CM

## 2023-02-01 DIAGNOSIS — K529 Noninfective gastroenteritis and colitis, unspecified: Secondary | ICD-10-CM | POA: Insufficient documentation

## 2023-02-01 DIAGNOSIS — I10 Essential (primary) hypertension: Secondary | ICD-10-CM | POA: Insufficient documentation

## 2023-02-01 DIAGNOSIS — R1915 Other abnormal bowel sounds: Secondary | ICD-10-CM | POA: Insufficient documentation

## 2023-02-01 DIAGNOSIS — E119 Type 2 diabetes mellitus without complications: Secondary | ICD-10-CM | POA: Diagnosis not present

## 2023-02-01 DIAGNOSIS — R1084 Generalized abdominal pain: Secondary | ICD-10-CM | POA: Insufficient documentation

## 2023-02-01 DIAGNOSIS — R197 Diarrhea, unspecified: Secondary | ICD-10-CM | POA: Diagnosis not present

## 2023-02-01 DIAGNOSIS — Z79899 Other long term (current) drug therapy: Secondary | ICD-10-CM | POA: Insufficient documentation

## 2023-02-01 DIAGNOSIS — R109 Unspecified abdominal pain: Secondary | ICD-10-CM | POA: Diagnosis present

## 2023-02-01 DIAGNOSIS — Z20822 Contact with and (suspected) exposure to covid-19: Secondary | ICD-10-CM | POA: Diagnosis not present

## 2023-02-01 DIAGNOSIS — Z7984 Long term (current) use of oral hypoglycemic drugs: Secondary | ICD-10-CM | POA: Diagnosis not present

## 2023-02-01 LAB — CBC WITH DIFFERENTIAL/PLATELET
Abs Immature Granulocytes: 0.01 10*3/uL (ref 0.00–0.07)
Basophils Absolute: 0 10*3/uL (ref 0.0–0.1)
Basophils Relative: 0 %
Eosinophils Absolute: 0 10*3/uL (ref 0.0–0.5)
Eosinophils Relative: 1 %
HCT: 42.5 % (ref 36.0–46.0)
Hemoglobin: 14.2 g/dL (ref 12.0–15.0)
Immature Granulocytes: 0 %
Lymphocytes Relative: 24 %
Lymphs Abs: 1.4 10*3/uL (ref 0.7–4.0)
MCH: 30.8 pg (ref 26.0–34.0)
MCHC: 33.4 g/dL (ref 30.0–36.0)
MCV: 92.2 fL (ref 80.0–100.0)
Monocytes Absolute: 0.5 10*3/uL (ref 0.1–1.0)
Monocytes Relative: 9 %
Neutro Abs: 3.8 10*3/uL (ref 1.7–7.7)
Neutrophils Relative %: 66 %
Platelets: 194 10*3/uL (ref 150–400)
RBC: 4.61 MIL/uL (ref 3.87–5.11)
RDW: 12 % (ref 11.5–15.5)
WBC: 5.8 10*3/uL (ref 4.0–10.5)
nRBC: 0 % (ref 0.0–0.2)

## 2023-02-01 LAB — LIPASE, BLOOD: Lipase: 21 U/L (ref 11–51)

## 2023-02-01 LAB — COMPREHENSIVE METABOLIC PANEL
ALT: 18 U/L (ref 0–44)
AST: 18 U/L (ref 15–41)
Albumin: 4 g/dL (ref 3.5–5.0)
Alkaline Phosphatase: 58 U/L (ref 38–126)
Anion gap: 11 (ref 5–15)
BUN: 9 mg/dL (ref 8–23)
CO2: 25 mmol/L (ref 22–32)
Calcium: 9.2 mg/dL (ref 8.9–10.3)
Chloride: 102 mmol/L (ref 98–111)
Creatinine, Ser: 0.69 mg/dL (ref 0.44–1.00)
GFR, Estimated: >60 mL/min (ref >60)
Glucose, Bld: 114 mg/dL — ABNORMAL HIGH (ref 70–99)
Potassium: 3.2 mmol/L — ABNORMAL LOW (ref 3.5–5.1)
Sodium: 138 mmol/L (ref 135–145)
Total Bilirubin: 2.1 mg/dL — ABNORMAL HIGH (ref 0.3–1.2)
Total Protein: 7 g/dL (ref 6.5–8.1)

## 2023-02-01 LAB — URINALYSIS, ROUTINE W REFLEX MICROSCOPIC
Bilirubin Urine: NEGATIVE
Glucose, UA: NEGATIVE mg/dL
Hgb urine dipstick: NEGATIVE
Ketones, ur: 5 mg/dL — AB
Leukocytes,Ua: NEGATIVE
Nitrite: NEGATIVE
Protein, ur: NEGATIVE mg/dL
Specific Gravity, Urine: 1.014 (ref 1.005–1.030)
pH: 5 (ref 5.0–8.0)

## 2023-02-01 LAB — SARS CORONAVIRUS 2 BY RT PCR: SARS Coronavirus 2 by RT PCR: NEGATIVE

## 2023-02-01 MED ORDER — ONDANSETRON HCL 4 MG/2ML IJ SOLN
4.0000 mg | Freq: Once | INTRAMUSCULAR | Status: AC
  Administered 2023-02-01: 4 mg via INTRAVENOUS
  Filled 2023-02-01: qty 2

## 2023-02-01 MED ORDER — NYSTATIN-TRIAMCINOLONE 100000-0.1 UNIT/GM-% EX OINT: 1.0000 | TOPICAL_OINTMENT | Freq: Two times a day (BID) | CUTANEOUS | 1 refills | Status: DC

## 2023-02-01 MED ORDER — IOHEXOL 300 MG/ML  SOLN
100.0000 mL | Freq: Once | INTRAMUSCULAR | Status: AC | PRN
Start: 2023-02-01 — End: 2023-02-01
  Administered 2023-02-01: 100 mL via INTRAVENOUS

## 2023-02-01 MED ORDER — HYOSCYAMINE SULFATE ER 0.375 MG PO TB12: 0.3750 mg | ORAL_TABLET | Freq: Two times a day (BID) | ORAL | 3 refills | Status: DC | PRN

## 2023-02-01 NOTE — ED Triage Notes (Signed)
Patient sent by GI doctor for possible bowel blockage. Patient reports she was seen by GI for diarrhea since Friday; had blood work and X-ray done and was told to come in for evaluation due to x-ray showing possible blockage. Upon arrival to ER, patient is alert and oriented, ambu. Reports some nausea and intermittent abd cramping

## 2023-02-01 NOTE — Progress Notes (Addendum)
Referring Provider: Garald Braver, FNP Primary Care Physician:  Garald Braver Primary GI Physician: Dr. Levon Hedger   Chief Complaint  Patient presents with   Diarrhea    Diarrhea started about one week ago. Last two days has only had liquids. Stool is slimy and turns the water rusty looking. Having pain in cramping in lower abdomen. Low grade fever 3 days ago. Nausea also started about one week ago.    HPI:   Ashlee Leblanc is a 72 y.o. female with past medical history of GERD, diabetes, hiatal hernia, hyperlipidemia, hypertension   Patient presenting today for diarrhea, abdominal pain, nausea.   Last seen in 2022, having 2 BMs per day then, advised to restart Levbid if diarrhea recurred  Present:  She began having diarrhea Friday night with loose to watery stools. 5 stools so far today, having upwards of 8-10 stools per day.  She has only had a few sips of ginger ale. She has not been able to eat much. She has some nausea. No vomiting. She had a low grade fever Monday morning but no other fevers. Does endorse she has been visiting ICU frequently over the past few weeks due to a sick friend being there. No rectal bleeding or melena. She notes stools look more rust colored. She denies any new medication, medication changes or antibiotics. She has some generalized abdominal cramping, sometimes in mid abdomen around umbilicus and sometimes diffuse lower abdomen. She has taken some imodium which did not seem to do much for her.   At baseline for the last year, she has looser stools (though she states 6-7 on bristol stool scale) and some fecal urgency since she has been off of her Levbid. She feels that cramping and frequency of stooling has increased since Friday. She notes 4-5 BMs per day at baseline.   She is requesting refill of mycolog cream she has used in the past for rectal irritation given frequent episodes of diarrhea.   Last Colonoscopy: 09/04/2019, normal terminal ileum,  diverticulosis of the colon, external hemorrhoids.  Random colonic biopsies were negative for microscopic colitis.  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   Claustrophobia    Complication of anesthesia    " hard time Knocking me out." "I wake up before I leave the OR."   Diabetes ([facility])    Type II   GERD (gastroesophageal reflux disease)    History of hiatal hernia    HLD (hyperlipidemia)    HTN (hypertension)    Sleep apnea 2001   can't wear mask - claystrophbic    Past Surgical History:  Procedure Laterality Date   BACK SURGERY     BIOPSY  09/04/2019   Procedure: BIOPSY;  Surgeon: Malissa Hippo, MD;  [facility];  Service: Endoscopy;;  random colon   CHOLECYSTECTOMY  2015   COLONOSCOPY N/A 09/04/2019   Procedure: COLONOSCOPY;  Surgeon: Malissa Hippo, MD;  [facility];  Service: Endoscopy;  Laterality: N/A;  1250   LUMBAR LAMINECTOMY/DECOMPRESSION MICRODISCECTOMY Right 04/02/2020   Procedure: Right Lumbar Three-Four Microdiscectomy;  Surgeon: Maeola Harman, MD;  Location: [facility];  Service: Neurosurgery;  Laterality: Right;  posterior   Neuromoa Left    foot   PLANTAR FASCIA SURGERY Bilateral    REPLACEMENT TOTAL KNEE BILATERAL     ROTATOR CUFF REPAIR     bilateral  at different dates   TRANSFORAMINAL LUMBAR INTERBODY FUSION (TLIF) WITH PEDICLE SCREW FIXATION 2 LEVEL Right 05/27/2020   Procedure: Right Lumbar Three-Four Lumbar Four-Five Transforaminal lumbar interbody fusion with Redo Decompression of Right Lumbar Three-Four;  Surgeon: Maeola Harman, MD;  Location: [facility] OR;  Service: Neurosurgery;  Laterality: Right;    Current Outpatient Medications  Medication Sig Dispense Refill   Cholecalciferol (VITAMIN D-3) 125 MCG (5000 UT) TABS Take 5,000 Units by mouth at bedtime.      enalapril (VASOTEC) 10 MG tablet Take 10 mg by mouth daily.     glipiZIDE (GLUCOTROL XL)  5 MG 24 hr tablet Take 5 mg by mouth 2 (two) times daily.     hydrochlorothiazide (MICROZIDE) 12.5 MG capsule Take 12.5 mg by mouth daily.     metFORMIN (GLUCOPHAGE) 1000 MG tablet Take 1,000 mg by mouth 2 (two) times daily with a meal.     montelukast (SINGULAIR) 10 MG tablet Take 10 mg by mouth at bedtime.     omeprazole (PRILOSEC) 20 MG capsule Take 20 mg by mouth daily.     pioglitazone (ACTOS) 45 MG tablet Take 45 mg by mouth daily.     Probiotic Product (PROBIOTIC PO) Take 1 capsule by mouth daily.     simvastatin (ZOCOR) 40 MG tablet Take 40 mg by mouth every evening.     Specialty Vitamins Products (BRAIN PO) Take 1 tablet by mouth in the morning and at bedtime. Cognium Brain Health Tablets     No current facility-administered medications for this visit.    Allergies as of 02/01/2023 - Review Complete 02/01/2023  Allergen Reaction Noted   Benadryl [diphenhydramine] Other (See Comments) 07/17/2019   Darvon [propoxyphene] Other (See Comments) 07/17/2019   Morphine and codeine Hives 07/17/2019   Tetracyclines & related Other (See Comments) 07/17/2019    Family History  Problem Relation Age of Onset   Colon cancer Brother     Social History   Socioeconomic History   Marital status: Widowed    Spouse name: Not on file   Number of children: Not on file   Years of education: Not on file   Highest education level: Not on file  Occupational History   Not on file  Tobacco Use   Smoking status: Former    Types: Cigarettes    Passive exposure: Current   Smokeless tobacco: Never   Tobacco comments:    03/30/20- over 35 years ago  Vaping Use   Vaping status: Never Used  Substance and Sexual Activity   Alcohol use: Never   Drug use: Never   Sexual activity: Not on file  Other Topics Concern   Not on file  Social History Narrative   Not on file   Social Determinants of Health   Financial Resource Strain: Not on file  Food Insecurity: Not on file  Transportation  Needs: Not on file  Physical Activity: Not on file  Stress: Not on file  Social Connections: Not on file    Review of systems General: negative for malaise, night sweats, fever, chills, weight loss Neck: Negative for lumps, goiter, pain and significant neck swelling Resp: Negative for cough, wheezing, dyspnea at rest CV: Negative for chest pain, leg swelling, palpitations, orthopnea GI: denies melena, hematochezia, vomiting, constipation, dysphagia, odyonophagia, early satiety or unintentional weight loss. +nausea +diarrhea +abdominal cramping  MSK: Negative for joint pain or swelling, back pain, and muscle pain. Derm: Negative for itching or rash Psych: Denies depression, anxiety, memory loss, confusion. No  homicidal or suicidal ideation.  Heme: Negative for prolonged bleeding, bruising easily, and swollen nodes. Endocrine: Negative for cold or heat intolerance, polyuria, polydipsia and goiter. Neuro: negative for tremor, gait imbalance, syncope and seizures. The remainder of the review of systems is noncontributory.  Physical Exam: BP (!) 153/84   Pulse 89   Temp 97.9 F (36.6 C) (Oral)   Ht 5\' 6"  (1.676 m)   Wt 247 lb (112 kg)   BMI 39.87 kg/m  General:   Alert and oriented. No distress noted. Pleasant and cooperative.  Head:  Normocephalic and atraumatic. Eyes:  Conjuctiva clear without scleral icterus. Mouth:  Oral mucosa pink and moist. Good dentition. No lesions. Heart: Normal rate and rhythm, s1 and s2 heart sounds present.  Lungs: Clear lung sounds in all lobes. Respirations equal and unlabored. Abdomen:  very minimal bowel sounds on exam, soft, non-tender and non-distended. No rebound or guarding. No HSM or masses noted. Derm: No palmar erythema or jaundice Msk:  Symmetrical without gross deformities. Normal posture. Extremities:  Without edema. Neurologic:  Alert and  oriented x4 Psych:  Alert and cooperative. Normal mood and affect.  Invalid input(s): "6 MONTHS"    ASSESSMENT: AJANEE OSWALT is a 72 y.o. female presenting today for nausea, abdominal cramping and diarrhea  Nausea/diarrhea/abdominal cramping since Friday evening. No vomiting, rectal bleeding or melena. Had a low grade fever Monday. No recent antibiotics but has frequented ICU at a hospital recently to visit a friend. Notably on exam, bowel sounds are quite hypoactive. She does endorse some bloating. Will obtain KUB to rule out underlying ileus or developing obstruction, though I suspect she may be dealing with a viral GI illness. Abdomen is not tender upon palpation. Will check basic labs, TSH and stool studies to rule out infectious etiology. I will send levbid for her to use for diarrhea and cramping, pending KUB is negative, I advised her not to take the medication until I have contacted her with the xray results.    PLAN:  GI pathogen panel, C diff  2. CBC, CMP, TSH   3. KUB  4. Mycolog cream BID PRN for rectal irritation 5. Stay well hydrated with zero sugar gatorade, water, ginger ale  6. Levbid Q12h PRN (hold until xray results)  All questions were answered, patient verbalized understanding and is in agreement with plan as outlined above.   Follow Up: 6-8 weeks   Ozan Maclay L. Jeanmarie Hubert, MSN, APRN, AGNP-C Adult-Gerontology Nurse Practitioner [facility]  I have reviewed the note and agree with the APP's assessment as described in this progress note  Katrinka Blazing, MD Gastroenterology and Hepatology [facility]

## 2023-02-01 NOTE — ED Notes (Signed)
Patient transported to CT 

## 2023-02-01 NOTE — ED Provider Notes (Signed)
[place] EMERGENCY DEPARTMENT AT [facility] Provider Note   CSN: [id number] Arrival date & time: 02/01/23  1827     History  Chief Complaint  Patient presents with   Diarrhea   Nausea   Abdominal Cramping    Ashlee Leblanc is a 72 y.o. female.   Diarrhea Associated symptoms: abdominal pain   Associated symptoms: no chills, no fever and no vomiting   Abdominal Cramping Associated symptoms include abdominal pain. Pertinent negatives include no chest pain and no shortness of breath.        Ashlee Leblanc is a 72 y.o. female with past medical history of irritable bowel, GERD, hiatal hernia, hypertension, type 2 diabetes who presents to the Emergency Department complaining of diffuse lower abdominal pain and bloating.  Feels as though her abdomen is swollen.  States she has had persistent diarrhea x 1 week.  Describes stools as watery and yellow to brown in color.  Denies bloody or black stools, and no recent antibiotic use.  She was seen by her GI provider's office earlier today and contacted this afternoon with recommendation to come to the emergency department for possible small bowel obstruction.  She endorses some nausea without vomiting.  No chest pain or shortness of breath no fever or chills.   Home Medications Prior to Admission medications   Medication Sig Start Date End Date Taking? Authorizing Provider  Cholecalciferol (VITAMIN D-3) 125 MCG (5000 UT) TABS Take 5,000 Units by mouth at bedtime.     [provider]  enalapril (VASOTEC) 10 MG tablet Take 10 mg by mouth daily.    [provider]  glipiZIDE (GLUCOTROL XL) 5 MG 24 hr tablet Take 5 mg by mouth 2 (two) times daily. 07/14/19   [provider]  hydrochlorothiazide (MICROZIDE) 12.5 MG capsule Take 12.5 mg by mouth daily. 07/31/19   [provider]  hyoscyamine (LEVBID) 0.375 MG 12 hr tablet Take 1 tablet (0.375 mg total) by mouth every 12 (twelve) hours as needed.  02/01/23   Carlan, Chelsea L, NP  metFORMIN (GLUCOPHAGE) 1000 MG tablet Take 1,000 mg by mouth 2 (two) times daily with a meal.    [provider]  montelukast (SINGULAIR) 10 MG tablet Take 10 mg by mouth at bedtime.    [provider]  nystatin-triamcinolone ointment (MYCOLOG) Apply 1 Application topically 2 (two) times daily. 02/01/23   Carlan, Chelsea L, NP  omeprazole (PRILOSEC) 20 MG capsule Take 20 mg by mouth daily.    [provider]  pioglitazone (ACTOS) 45 MG tablet Take 45 mg by mouth daily.    [provider]  Probiotic Product (PROBIOTIC PO) Take 1 capsule by mouth daily.    [provider]  simvastatin (ZOCOR) 40 MG tablet Take 40 mg by mouth every evening.    [provider]  Specialty Vitamins Products (BRAIN PO) Take 1 tablet by mouth in the morning and at bedtime. Cognium Brain Health Tablets    [provider]      Allergies    Benadryl [diphenhydramine], Darvon [propoxyphene], Morphine and codeine, and Tetracyclines & related    Review of Systems   Review of Systems  Constitutional:  Positive for appetite change. Negative for chills and fever.  HENT:  Negative for sore throat and trouble swallowing.   Respiratory:  Negative for shortness of breath.   Cardiovascular:  Negative for chest pain.  Gastrointestinal:  Positive for abdominal pain, diarrhea and nausea. Negative for vomiting.  Genitourinary:  Negative for difficulty urinating and dysuria.  Musculoskeletal:  Negative for back pain.  Neurological:  Negative for dizziness, weakness and numbness.    Physical Exam Updated Vital Signs BP (!) 159/76 (BP Location: Left Arm)   Pulse 72   Temp 97.9 F (36.6 C) (Oral)   Resp 17   Ht 5\' 6"  (1.676 m)   Wt 112 kg   SpO2 99%   BMI 39.87 kg/m  Physical Exam Vitals and nursing note reviewed.  Constitutional:      General: She is not in acute distress.    Appearance: Normal appearance. She is not  ill-appearing or toxic-appearing.  HENT:     Mouth/Throat:     Mouth: Mucous membranes are moist.  Cardiovascular:     Rate and Rhythm: Normal rate and regular rhythm.     Pulses: Normal pulses.  Pulmonary:     Effort: Pulmonary effort is normal.  Abdominal:     General: There is no distension.     Palpations: Abdomen is soft.     Tenderness: There is abdominal tenderness. There is no right CVA tenderness, left CVA tenderness or guarding.  Musculoskeletal:     Right lower leg: No edema.     Left lower leg: No edema.  Skin:    General: Skin is warm.     Capillary Refill: Capillary refill takes less than 2 seconds.     Findings: No erythema or rash.  Neurological:     General: No focal deficit present.     Mental Status: She is alert.     Sensory: No sensory deficit.     Motor: No weakness.    ED Results / Procedures / Treatments   Labs (all labs ordered are listed, but only abnormal results are displayed) Labs Reviewed  COMPREHENSIVE METABOLIC PANEL - Abnormal; Notable for the following components:      Result Value   Potassium 3.2 (*)    Glucose, Bld 114 (*)    Total Bilirubin 2.1 (*)    All other components within normal limits  URINALYSIS, ROUTINE W REFLEX MICROSCOPIC - Abnormal; Notable for the following components:   Ketones, ur 5 (*)    All other components within normal limits  SARS CORONAVIRUS 2 BY RT PCR  CBC WITH DIFFERENTIAL/PLATELET  LIPASE, BLOOD    EKG None  Radiology CT ABDOMEN PELVIS W CONTRAST  Result Date: 02/01/2023 CLINICAL DATA:  Abdominal pain, acute, nonlocalized XR from earlier today poss SBO EXAM: CT ABDOMEN AND PELVIS WITH CONTRAST TECHNIQUE: Multidetector CT imaging of the abdomen and pelvis was performed using the standard protocol following bolus administration of intravenous contrast. RADIATION DOSE REDUCTION: This exam was performed according to the departmental dose-optimization program which includes automated exposure control,  adjustment of the mA and/or kV according to patient size and/or use of iterative reconstruction technique. CONTRAST:  OMNIPAQUE IOHEXOL 300 MG/ML  SOLN COMPARISON:  02/01/2023 FINDINGS: Lower chest: 6 mm nodule in the left lower lobe. No effusions. Heart is normal size. Hepatobiliary: No focal liver abnormality is seen. Status post cholecystectomy. No biliary dilatation. Pancreas: No focal abnormality or ductal dilatation. Spleen: No focal abnormality.  Normal size. Adrenals/Urinary Tract: 2.1 cm low-density nodule in the right adrenal gland most compatible with adenoma. Left adrenal gland unremarkable. 7.4 cm cyst in the mid to lower pole of the right kidney. 4 cm cyst in the upper pole the right kidney. These appear simple/benign. No follow-up imaging recommended. No stones or hydronephrosis. Urinary bladder unremarkable. Stomach/Bowel:  Stomach and small bowel decompressed, unremarkable. There appear to be areas of wall thickening within the colon including the ascending colon, descending colon and sigmoid colon, but the colon is decompressed which could be the cause of the thickened appearance. Recommend clinical correlation to exclude colitis. Normal appendix. Vascular/Lymphatic: Aortoiliac atherosclerosis. No evidence of aneurysm or adenopathy. Reproductive: Uterus and adnexa unremarkable.  No mass. Other: No free fluid or free air. Small umbilical and supraumbilical ventral hernia containing fat. Musculoskeletal: Postoperative changes in the lumbar spine. No acute bony abnormality. IMPRESSION: No evidence of bowel obstruction. Areas of mild apparent wall thickening within the ascending colon, descending colon and sigmoid colon. These may be related to decompressed state/nondistention, but recommend clinical correlation to exclude colitis. Umbilical and supraumbilical hernias containing fat. Aortoiliac atherosclerosis. Electronically Signed   By: Charlett Nose M.D.   On: 02/01/2023 23:39   DG Abd 1  View  Result Date: 02/01/2023 CLINICAL DATA:  Nausea and diarrhea, hypoactive bowel sounds and bowel cramps. EXAM: ABDOMEN - 1 VIEW COMPARISON:  None Available. FINDINGS: Posterolateral rod pedicle screw fixation at L3-L4-L5 with interbody spacers. Lumbar spondylosis noted. Scattered gas in loops of small bowel. A loop of right abdominal small bowel measures up to 3.4 cm in diameter, which is mildly abnormally dilated. Colon is relatively gasless as is much of the rest of the small bowel. Contour prominence of the right kidney lower pole likely corresponding to the known cyst shown on MRI of 05/05/2020. IMPRESSION: 1. Mildly abnormally dilated loop of small bowel in the right abdomen, with relatively gasless colon. This could reflect adynamic ileus or partial/early small bowel obstruction. 2. Lumbar spondylosis and postoperative findings. Electronically Signed   By: Gaylyn Rong M.D.   On: 02/01/2023 15:40    Procedures Procedures    Medications Ordered in ED Medications  ondansetron (ZOFRAN) injection 4 mg (has no administration in time range)    ED Course/ Medical Decision Making/ A&P                                 Medical Decision Making Patient sent here from [facility] office for evaluation of possible SBO.  Patient endorses 1 week history of watery diarrhea.  Nonbloody or black.  Some nausea without vomiting no fever or chills.  Some mild diffuse tenderness of the lower abdomen.  Clinically, she is well-appearing.  No concerning symptoms for sepsis.  SBO., acute diverticulitis, colitis possible viral versus parasitic infection also considered.  C. difficile a consideration but felt less likely as patient denies any recent antibiotic use or fever  Amount and/or Complexity of Data Reviewed Labs: ordered.    Details: Labs overall unremarkable.  Mild hypokalemia with potassium at 3.2.  She was given oral supplementation here.  Patient has GI panel and C. difficile ordered from her GI  provider earlier today, results are still pending Radiology: ordered.    Details: CT abdomen and pelvis without evidence of bowel obstruction.  There is some wall thickening within the colon possibly related to colitis Discussion of management or test interpretation with external provider(s): Patient with probable colitis.  Overall reassuring workup this evening.  Given oral potassium and antiemetic.  Patient had C. difficile and GI panel ordered by GI earlier results are still pending.  CT A/P without evidence of SBO.  Patient appears appropriate for discharge home, prescription will be written for Flagyl and patient understands to hold prescription and contact her  GI provider tomorrow for further recommendation  Risk Prescription drug management.           Final Clinical Impression(s) / ED Diagnoses Final diagnoses:  Colitis  Diarrhea, unspecified type    Rx / DC Orders ED Discharge Orders     None         Rosey Bath 02/02/23 Audley Hose, MD 02/02/23 1450

## 2023-02-01 NOTE — Telephone Encounter (Signed)
Tried to call no answer

## 2023-02-01 NOTE — Patient Instructions (Addendum)
I would like to check some labs and stool testing to rule out obvious infection. I am also going to get an x-ray of your abdomen and sure bowel sounds are very decreased. I have sent a refill of the Mycolog cream to use for rectal irritation. I have also sent Levbid to take up to every 12 hours for abdominal cramping and diarrhea, however, please do not take this medication until I have been in touch with you about the results of your x-ray. For now make sure that you are staying well-hydrated, alternating water, ginger ale, zero sugar Gatorade, even if you are not able to eat.  Follow up  6-8 weeks

## 2023-02-02 ENCOUNTER — Telehealth (INDEPENDENT_AMBULATORY_CARE_PROVIDER_SITE_OTHER): Payer: Self-pay | Admitting: *Deleted

## 2023-02-02 LAB — COMPREHENSIVE METABOLIC PANEL
AG Ratio: 1.8 (calc) (ref 1.0–2.5)
ALT: 14 U/L (ref 6–29)
AST: 15 U/L (ref 10–35)
Albumin: 4.3 g/dL (ref 3.6–5.1)
Alkaline phosphatase (APISO): 62 U/L (ref 37–153)
BUN: 9 mg/dL (ref 7–25)
CO2: 27 mmol/L (ref 20–32)
Calcium: 10 mg/dL (ref 8.6–10.4)
Chloride: 103 mmol/L (ref 98–110)
Creat: 0.74 mg/dL (ref 0.60–1.00)
Globulin: 2.4 g/dL (ref 1.9–3.7)
Glucose, Bld: 155 mg/dL — ABNORMAL HIGH (ref 65–99)
Potassium: 4.1 mmol/L (ref 3.5–5.3)
Sodium: 142 mmol/L (ref 135–146)
Total Bilirubin: 1.8 mg/dL — ABNORMAL HIGH (ref 0.2–1.2)
Total Protein: 6.7 g/dL (ref 6.1–8.1)

## 2023-02-02 LAB — CBC
HCT: 42.7 % (ref 35.0–45.0)
Hemoglobin: 14.2 g/dL (ref 11.7–15.5)
MCH: 30.7 pg (ref 27.0–33.0)
MCHC: 33.3 g/dL (ref 32.0–36.0)
MCV: 92.2 fL (ref 80.0–100.0)
MPV: 10.3 fL (ref 7.5–12.5)
Platelets: 214 10*3/uL (ref 140–400)
RBC: 4.63 10*6/uL (ref 3.80–5.10)
RDW: 12.1 % (ref 11.0–15.0)
WBC: 6.6 10*3/uL (ref 3.8–10.8)

## 2023-02-02 LAB — TSH: TSH: 0.83 m[IU]/L (ref 0.40–4.50)

## 2023-02-02 MED ORDER — METRONIDAZOLE 500 MG PO TABS: 500.0000 mg | ORAL_TABLET | Freq: Two times a day (BID) | ORAL | 0 refills | Status: DC

## 2023-02-02 MED ORDER — POTASSIUM CHLORIDE CRYS ER 20 MEQ PO TBCR
40.0000 meq | EXTENDED_RELEASE_TABLET | Freq: Once | ORAL | Status: AC
  Administered 2023-02-02: 40 meq via ORAL
  Filled 2023-02-02: qty 2

## 2023-02-02 NOTE — Telephone Encounter (Signed)
Pt sent to ED yesterday after [facility] got xray results back. Pt states she was told to call office today to see if she start metronidazole that was prescribed at ED. States she was told to call before picking up med.   [phone]

## 2023-02-02 NOTE — Discharge Instructions (Signed)
Your blood work this evening was overall reassuring.  The CT of your abdomen and pelvis did not show evidence of a bowel obstruction.  Your diarrhea may be related to colitis.  Some of your test results ordered by your gastroenterologist are still pending.  I have written a prescription for an antibiotic, please contact your gastroenterologist tomorrow to see if they want you to take the medication.  I recommend BRATY diet to help control your diarrhea, this consists of bananas rice applesauce toast and yogurt.  Return to the emergency department for any new or worsening symptoms.

## 2023-02-02 NOTE — Telephone Encounter (Signed)
Left message to return call 

## 2023-02-02 NOTE — Telephone Encounter (Signed)
Spoke with chelsea on phone and per [facility] - pt needs to start metronidazole that ED prescribed since CT showed colitis. I called patient and let her know to pick up med and take as prescribed from ED doctor. She verbalized understanding and also asked if she needed to have colonoscopy sooner than October 2025. She states ED provider told her she might need one sooner.

## 2023-02-05 NOTE — Telephone Encounter (Signed)
Discussed with patient per Ashlee Leblanc - Yes, given findings of colitis on CT, we will discuss setting up colonoscopy at her follow up visit in a few weeks. Patient verbalized understanding.

## 2023-02-05 NOTE — Telephone Encounter (Signed)
Left message to return call 

## 2023-02-06 LAB — GASTROINTESTINAL PATHOGEN PNL
CampyloBacter Group: NOT DETECTED
Norovirus GI/GII: NOT DETECTED
Rotavirus A: NOT DETECTED
Salmonella species: NOT DETECTED
Shiga Toxin 1: NOT DETECTED
Shiga Toxin 2: NOT DETECTED
Shigella Species: NOT DETECTED
Vibrio Group: NOT DETECTED
Yersinia enterocolitica: NOT DETECTED

## 2023-02-06 LAB — C. DIFFICILE GDH AND TOXIN A/B
GDH ANTIGEN: NOT DETECTED
MICRO NUMBER:: 15375517
SPECIMEN QUALITY:: ADEQUATE
TOXIN A AND B: NOT DETECTED

## 2023-03-27 ENCOUNTER — Ambulatory Visit (INDEPENDENT_AMBULATORY_CARE_PROVIDER_SITE_OTHER): Payer: Medicare Other | Admitting: Gastroenterology

## 2023-03-27 ENCOUNTER — Encounter (INDEPENDENT_AMBULATORY_CARE_PROVIDER_SITE_OTHER): Payer: Self-pay | Admitting: Gastroenterology

## 2023-03-27 VITALS — BP 124/73 | HR 111 | Temp 97.9°F | Ht 66.0 in | Wt 246.1 lb

## 2023-03-27 DIAGNOSIS — R197 Diarrhea, unspecified: Secondary | ICD-10-CM

## 2023-03-27 DIAGNOSIS — R1030 Lower abdominal pain, unspecified: Secondary | ICD-10-CM | POA: Insufficient documentation

## 2023-03-27 MED ORDER — NYSTATIN-TRIAMCINOLONE 100000-0.1 UNIT/GM-% EX CREA: 1.0000 | TOPICAL_CREAM | Freq: Three times a day (TID) | CUTANEOUS | 3 refills | Status: AC

## 2023-03-27 NOTE — H&P (View-Only) (Signed)
Referring Provider: Garald Braver, FNP Primary Care Physician:  Garald Braver Primary GI Physician: Dr. Levon Hedger   Chief Complaint  Patient presents with   Rectal Pain    Follow up on rectal irritation. Mycolog ointment was sent in and she states cream works better.    Diarrhea    Follow up on loose stools. Has them on most days. Takes hysocamine bid for abdominal cramping states it helps a little bit.     HPI:   Ashlee Leblanc is a 72 y.o. female with past medical history of GERD, diabetes, hiatal hernia, hyperlipidemia, hypertension   Patient presenting today for follow up of diarrhea and rectal discomfort  Last seen August 2024, at that time recent onset of diarrhea, though looser stools at baseline. Some urgency and cramping since being off levbid. Requesting refill of mycolog cream used in the past for rectal irritation.   Recommended to check stool studies, KUB, CBC, CMP, TSH, levbid q12h PRN, mycolog cream BID PRN  KUB showed ileus vs early partial SBO, patient passing minimal stool/gas, recommended to proceed to the ED for further evaluation  CT a/p with contrast done in the ED showed No evidence of bowel obstruction. Areas of mild apparent wall thickening within the ascending colon, descending colon and sigmoid colon. These may be related to decompressed state/nondistention, but recommend clinical correlation to exclude colitis. Umbilical and supraumbilical hernias containing fat.  Discharged with flagyl  GI path panel, C diff testing was negative   TSH and CBC WNL, CMP with mildly elevated T bili of 1.8/2.1  Present: Patient still having diarrhea, notes on average 6 BMs per day but sometimes more. Stools start as looser with first BM and transitions to watery. She has urgency and some incontinence. She is taking levsin up to twice daily which helps to slow things down as far as urgency. She has some intermittent lower abdominal pain which sometimes improves  with defecation. She usually has pain prior to defecating. No rectal bleeding or melena. Felt that flagyl may have helped her abdominal pain and diarrhea slightly. Has occasional nausea. No vomiting. Appetite is not great.   She was started on paroxetine a few weeks ago but denies change in GI symptoms since this was began.   Notably patient's mother did have history of UC   Last Colonoscopy: 09/04/2019, normal terminal ileum, diverticulosis of the colon, external hemorrhoids.  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   Claustrophobia    Complication of anesthesia    " hard time Knocking me out." "I wake up before I leave the OR."   Diabetes (HCC)    Type II   GERD (gastroesophageal reflux disease)    History of hiatal hernia    HLD (hyperlipidemia)    HTN (hypertension)    Sleep apnea 2001   can't wear mask - claystrophbic    Past Surgical History:  Procedure Laterality Date   BACK SURGERY     BIOPSY  09/04/2019   Procedure: BIOPSY;  Surgeon: Malissa Hippo, MD;  Location: [facility];  Service: Endoscopy;;  random colon   CHOLECYSTECTOMY  2015   COLONOSCOPY N/A 09/04/2019   Procedure: COLONOSCOPY;  Surgeon: Malissa Hippo, MD;  Location: [facility];  Service: Endoscopy;  Laterality: N/A;  1250  LUMBAR LAMINECTOMY/DECOMPRESSION MICRODISCECTOMY Right 04/02/2020   Procedure: Right Lumbar Three-Four Microdiscectomy;  Surgeon: Maeola Harman, MD;  Location: [facility] OR;  Service: Neurosurgery;  Laterality: Right;  posterior   Neuromoa Left    foot   PLANTAR FASCIA SURGERY Bilateral    REPLACEMENT TOTAL KNEE BILATERAL     ROTATOR CUFF REPAIR     bilateral at different dates   TRANSFORAMINAL LUMBAR INTERBODY FUSION (TLIF) WITH PEDICLE SCREW FIXATION 2 LEVEL Right 05/27/2020   Procedure: Right Lumbar Three-Four  Lumbar Four-Five Transforaminal lumbar interbody fusion with Redo Decompression of Right Lumbar Three-Four;  Surgeon: Maeola Harman, MD;  Location: [facility] OR;  Service: Neurosurgery;  Laterality: Right;    Current Outpatient Medications  Medication Sig Dispense Refill   Cholecalciferol (VITAMIN D-3) 125 MCG (5000 UT) TABS Take 5,000 Units by mouth at bedtime.      enalapril (VASOTEC) 10 MG tablet Take 10 mg by mouth daily.     glipiZIDE (GLUCOTROL XL) 5 MG 24 hr tablet Take 5 mg by mouth 2 (two) times daily.     hydrochlorothiazide (MICROZIDE) 12.5 MG capsule Take 12.5 mg by mouth daily.     hyoscyamine (LEVBID) 0.375 MG 12 hr tablet Take 1 tablet (0.375 mg total) by mouth every 12 (twelve) hours as needed. 60 tablet 3   metFORMIN (GLUCOPHAGE) 1000 MG tablet Take 1,000 mg by mouth 2 (two) times daily with a meal.     montelukast (SINGULAIR) 10 MG tablet Take 10 mg by mouth at bedtime.     nystatin-triamcinolone ointment (MYCOLOG) Apply 1 Application topically 2 (two) times daily. 60 g 1   omeprazole (PRILOSEC) 20 MG capsule Take 20 mg by mouth daily.     PARoxetine (PAXIL) 10 MG tablet Take 10 mg by mouth daily.     pioglitazone (ACTOS) 45 MG tablet Take 45 mg by mouth daily.     Probiotic Product (PROBIOTIC PO) Take 1 capsule by mouth daily.     simvastatin (ZOCOR) 40 MG tablet Take 40 mg by mouth every evening.     Specialty Vitamins Products (BRAIN PO) Take 1 tablet by mouth in the morning and at bedtime. Cognium Brain Health Tablets     No current facility-administered medications for this visit.    Allergies as of 03/27/2023 - Review Complete 03/27/2023  Allergen Reaction Noted   Benadryl [diphenhydramine] Other (See Comments) 07/17/2019   Darvon [propoxyphene] Other (See Comments) 07/17/2019   Morphine and codeine Hives 07/17/2019   Tetracyclines & related Other (See Comments) 07/17/2019    Family History  Problem Relation Age of Onset   Colon cancer Brother     Social History    Socioeconomic History   Marital status: Widowed    Spouse name: Not on file   Number of children: Not on file   Years of education: Not on file   Highest education level: Not on file  Occupational History   Not on file  Tobacco Use   Smoking status: Former    Types: Cigarettes    Passive exposure: Current   Smokeless tobacco: Never   Tobacco comments:    03/30/20- over 35 years ago  Vaping Use   Vaping status: Never Used  Substance and Sexual Activity   Alcohol use: Never   Drug use: Never   Sexual activity: Not on file  Other Topics Concern   Not on file  Social History Narrative   Not on file   Social Determinants of Health   Financial Resource Strain: Not  on file  Food Insecurity: Not on file  Transportation Needs: Not on file  Physical Activity: Not on file  Stress: Not on file  Social Connections: Not on file    Review of systems General: negative for malaise, night sweats, fever, chills, weight loss Neck: Negative for lumps, goiter, pain and significant neck swelling Resp: Negative for cough, wheezing, dyspnea at rest CV: Negative for chest pain, leg swelling, palpitations, orthopnea GI: denies melena, hematochezia, vomiting, constipation, dysphagia, odyonophagia, early satiety or unintentional weight loss. +diarrhea +lower abdominal pain +nausea  MSK: Negative for joint pain or swelling, back pain, and muscle pain. Derm: Negative for itching or rash Psych: Denies depression, anxiety, memory loss, confusion. No homicidal or suicidal ideation.  Heme: Negative for prolonged bleeding, bruising easily, and swollen nodes. Endocrine: Negative for cold or heat intolerance, polyuria, polydipsia and goiter. Neuro: negative for tremor, gait imbalance, syncope and seizures. The remainder of the review of systems is noncontributory.  Physical Exam: There were no vitals taken for this visit. General:   Alert and oriented. No distress noted. Pleasant and cooperative.   Head:  Normocephalic and atraumatic. Eyes:  Conjuctiva clear without scleral icterus. Mouth:  Oral mucosa pink and moist. Good dentition. No lesions. Heart: Normal rate and rhythm, s1 and s2 heart sounds present.  Lungs: Clear lung sounds in all lobes. Respirations equal and unlabored. Abdomen:  +BS, soft, non-tender and non-distended. No rebound or guarding. No HSM or masses noted. Neurologic:  Alert and  oriented x4 Psych:  Alert and cooperative. Normal mood and affect.  Invalid input(s): "6 MONTHS"   ASSESSMENT: Ashlee Leblanc is a 72 y.o. female presenting today for follow up of diarrhea and abdominal pain  Diarrhea and abdominal pain that began in August, CT at that time with concerns for colitis, treated in the ED with course of flagyl. Continues to have loose to watery stools, atleast 6 per day, sometimes more and lower abdominal discomfort. TSH was WNL, Gi path panel and C diff testing was negative. Celiac panel in 2021 was also negative. Would recommend proceeding with Colonoscopy for further evaluation of her symptoms and to follow up on recent Colitis as noted on CT.    PLAN:  Schedule colonoscopy with random colonic biopsies ASA II 2. Mycolog cream TID PRN for rectal irritation  3. Continue with hyoscyamine BID PRN   All questions were answered, patient verbalized understanding and is in agreement with plan as outlined above.   Follow Up: 4 monts   Josefine Fuhr L. Jeanmarie Hubert, MSN, APRN, AGNP-C Adult-Gerontology Nurse Practitioner [facility]  I have reviewed the note and agree with the APP's assessment as described in this progress note  Depending on results of colonoscopy, may proceed with trial of colestipol.  Katrinka Blazing, MD Gastroenterology and Hepatology [facility] Gastroenterology

## 2023-03-27 NOTE — Progress Notes (Signed)
Referring Provider: Garald Braver, FNP Primary Care Physician:  Garald Braver Primary GI Physician: Dr. Levon Hedger   Chief Complaint  Patient presents with   Rectal Pain    Follow up on rectal irritation. Mycolog ointment was sent in and she states cream works better.    Diarrhea    Follow up on loose stools. Has them on most days. Takes hysocamine bid for abdominal cramping states it helps a little bit.     HPI:   Ashlee Leblanc is a 72 y.o. female with past medical history of GERD, diabetes, hiatal hernia, hyperlipidemia, hypertension   Patient presenting today for follow up of diarrhea and rectal discomfort  Last seen August 2024, at that time recent onset of diarrhea, though looser stools at baseline. Some urgency and cramping since being off levbid. Requesting refill of mycolog cream used in the past for rectal irritation.   Recommended to check stool studies, KUB, CBC, CMP, TSH, levbid q12h PRN, mycolog cream BID PRN  KUB showed ileus vs early partial SBO, patient passing minimal stool/gas, recommended to proceed to the ED for further evaluation  CT a/p with contrast done in the ED showed No evidence of bowel obstruction. Areas of mild apparent wall thickening within the ascending colon, descending colon and sigmoid colon. These may be related to decompressed state/nondistention, but recommend clinical correlation to exclude colitis. Umbilical and supraumbilical hernias containing fat.  Discharged with flagyl  GI path panel, C diff testing was negative   TSH and CBC WNL, CMP with mildly elevated T bili of 1.8/2.1  Present: Patient still having diarrhea, notes on average 6 BMs per day but sometimes more. Stools start as looser with first BM and transitions to watery. She has urgency and some incontinence. She is taking levsin up to twice daily which helps to slow things down as far as urgency. She has some intermittent lower abdominal pain which sometimes improves  with defecation. She usually has pain prior to defecating. No rectal bleeding or melena. Felt that flagyl may have helped her abdominal pain and diarrhea slightly. Has occasional nausea. No vomiting. Appetite is not great.   She was started on paroxetine a few weeks ago but denies change in GI symptoms since this was began.   Notably patient's mother did have history of UC   Last Colonoscopy: 09/04/2019, normal terminal ileum, diverticulosis of the colon, external hemorrhoids.  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   Claustrophobia    Complication of anesthesia    " hard time Knocking me out." "I wake up before I leave the OR."   Diabetes (HCC)    Type II   GERD (gastroesophageal reflux disease)    History of hiatal hernia    HLD (hyperlipidemia)    HTN (hypertension)    Sleep apnea 2001   can't wear mask - claystrophbic    Past Surgical History:  Procedure Laterality Date   BACK SURGERY     BIOPSY  09/04/2019   Procedure: BIOPSY;  Surgeon: Malissa Hippo, MD;  Location: [facility];  Service: Endoscopy;;  random colon   CHOLECYSTECTOMY  2015   COLONOSCOPY N/A 09/04/2019   Procedure: COLONOSCOPY;  Surgeon: Malissa Hippo, MD;  Location: [facility];  Service: Endoscopy;  Laterality: N/A;  1250  LUMBAR LAMINECTOMY/DECOMPRESSION MICRODISCECTOMY Right 04/02/2020   Procedure: Right Lumbar Three-Four Microdiscectomy;  Surgeon: Maeola Harman, MD;  Location: [facility] OR;  Service: Neurosurgery;  Laterality: Right;  posterior   Neuromoa Left    foot   PLANTAR FASCIA SURGERY Bilateral    REPLACEMENT TOTAL KNEE BILATERAL     ROTATOR CUFF REPAIR     bilateral at different dates   TRANSFORAMINAL LUMBAR INTERBODY FUSION (TLIF) WITH PEDICLE SCREW FIXATION 2 LEVEL Right 05/27/2020   Procedure: Right Lumbar Three-Four  Lumbar Four-Five Transforaminal lumbar interbody fusion with Redo Decompression of Right Lumbar Three-Four;  Surgeon: Maeola Harman, MD;  Location: [facility] OR;  Service: Neurosurgery;  Laterality: Right;    Current Outpatient Medications  Medication Sig Dispense Refill   Cholecalciferol (VITAMIN D-3) 125 MCG (5000 UT) TABS Take 5,000 Units by mouth at bedtime.      enalapril (VASOTEC) 10 MG tablet Take 10 mg by mouth daily.     glipiZIDE (GLUCOTROL XL) 5 MG 24 hr tablet Take 5 mg by mouth 2 (two) times daily.     hydrochlorothiazide (MICROZIDE) 12.5 MG capsule Take 12.5 mg by mouth daily.     hyoscyamine (LEVBID) 0.375 MG 12 hr tablet Take 1 tablet (0.375 mg total) by mouth every 12 (twelve) hours as needed. 60 tablet 3   metFORMIN (GLUCOPHAGE) 1000 MG tablet Take 1,000 mg by mouth 2 (two) times daily with a meal.     montelukast (SINGULAIR) 10 MG tablet Take 10 mg by mouth at bedtime.     nystatin-triamcinolone ointment (MYCOLOG) Apply 1 Application topically 2 (two) times daily. 60 g 1   omeprazole (PRILOSEC) 20 MG capsule Take 20 mg by mouth daily.     PARoxetine (PAXIL) 10 MG tablet Take 10 mg by mouth daily.     pioglitazone (ACTOS) 45 MG tablet Take 45 mg by mouth daily.     Probiotic Product (PROBIOTIC PO) Take 1 capsule by mouth daily.     simvastatin (ZOCOR) 40 MG tablet Take 40 mg by mouth every evening.     Specialty Vitamins Products (BRAIN PO) Take 1 tablet by mouth in the morning and at bedtime. Cognium Brain Health Tablets     No current facility-administered medications for this visit.    Allergies as of 03/27/2023 - Review Complete 03/27/2023  Allergen Reaction Noted   Benadryl [diphenhydramine] Other (See Comments) 07/17/2019   Darvon [propoxyphene] Other (See Comments) 07/17/2019   Morphine and codeine Hives 07/17/2019   Tetracyclines & related Other (See Comments) 07/17/2019    Family History  Problem Relation Age of Onset   Colon cancer Brother     Social History    Socioeconomic History   Marital status: Widowed    Spouse name: Not on file   Number of children: Not on file   Years of education: Not on file   Highest education level: Not on file  Occupational History   Not on file  Tobacco Use   Smoking status: Former    Types: Cigarettes    Passive exposure: Current   Smokeless tobacco: Never   Tobacco comments:    03/30/20- over 35 years ago  Vaping Use   Vaping status: Never Used  Substance and Sexual Activity   Alcohol use: Never   Drug use: Never   Sexual activity: Not on file  Other Topics Concern   Not on file  Social History Narrative   Not on file   Social Determinants of Health   Financial Resource Strain: Not  on file  Food Insecurity: Not on file  Transportation Needs: Not on file  Physical Activity: Not on file  Stress: Not on file  Social Connections: Not on file    Review of systems General: negative for malaise, night sweats, fever, chills, weight loss Neck: Negative for lumps, goiter, pain and significant neck swelling Resp: Negative for cough, wheezing, dyspnea at rest CV: Negative for chest pain, leg swelling, palpitations, orthopnea GI: denies melena, hematochezia, vomiting, constipation, dysphagia, odyonophagia, early satiety or unintentional weight loss. +diarrhea +lower abdominal pain +nausea  MSK: Negative for joint pain or swelling, back pain, and muscle pain. Derm: Negative for itching or rash Psych: Denies depression, anxiety, memory loss, confusion. No homicidal or suicidal ideation.  Heme: Negative for prolonged bleeding, bruising easily, and swollen nodes. Endocrine: Negative for cold or heat intolerance, polyuria, polydipsia and goiter. Neuro: negative for tremor, gait imbalance, syncope and seizures. The remainder of the review of systems is noncontributory.  Physical Exam: There were no vitals taken for this visit. General:   Alert and oriented. No distress noted. Pleasant and cooperative.   Head:  Normocephalic and atraumatic. Eyes:  Conjuctiva clear without scleral icterus. Mouth:  Oral mucosa pink and moist. Good dentition. No lesions. Heart: Normal rate and rhythm, s1 and s2 heart sounds present.  Lungs: Clear lung sounds in all lobes. Respirations equal and unlabored. Abdomen:  +BS, soft, non-tender and non-distended. No rebound or guarding. No HSM or masses noted. Neurologic:  Alert and  oriented x4 Psych:  Alert and cooperative. Normal mood and affect.  Invalid input(s): "6 MONTHS"   ASSESSMENT: Ashlee Leblanc is a 72 y.o. female presenting today for follow up of diarrhea and abdominal pain  Diarrhea and abdominal pain that began in August, CT at that time with concerns for colitis, treated in the ED with course of flagyl. Continues to have loose to watery stools, atleast 6 per day, sometimes more and lower abdominal discomfort. TSH was WNL, Gi path panel and C diff testing was negative. Celiac panel in 2021 was also negative. Would recommend proceeding with Colonoscopy for further evaluation of her symptoms and to follow up on recent Colitis as noted on CT.    PLAN:  Schedule colonoscopy with random colonic biopsies ASA II 2. Mycolog cream TID PRN for rectal irritation  3. Continue with hyoscyamine BID PRN   All questions were answered, patient verbalized understanding and is in agreement with plan as outlined above.   Follow Up: 4 monts   Margurite Duffy L. Jeanmarie Hubert, MSN, APRN, AGNP-C Adult-Gerontology Nurse Practitioner [facility]  I have reviewed the note and agree with the APP's assessment as described in this progress note  Depending on results of colonoscopy, may proceed with trial of colestipol.  Katrinka Blazing, MD Gastroenterology and Hepatology [facility]

## 2023-03-27 NOTE — Patient Instructions (Signed)
We will Schedule you for colonoscopy for further evaluation of your symptoms   Continue with hyoscyamine twice daily as needed I have sent mycolog cream to be used up to three times per day for rectal irritation  Follow up 4 months  It was a pleasure to see you today. I want to create trusting relationships with patients and provide genuine, compassionate, and quality care. I truly value your feedback! please be on the lookout for a survey regarding your visit with me today. I appreciate your input about our visit and your time in completing this!    Marwa Fuhrman L. Jeanmarie Hubert, MSN, APRN, AGNP-C Adult-Gerontology Nurse Practitioner [facility]

## 2023-03-29 ENCOUNTER — Telehealth (INDEPENDENT_AMBULATORY_CARE_PROVIDER_SITE_OTHER): Payer: Self-pay | Admitting: Gastroenterology

## 2023-03-29 DIAGNOSIS — R9389 Abnormal findings on diagnostic imaging of other specified body structures: Secondary | ICD-10-CM

## 2023-03-29 DIAGNOSIS — R197 Diarrhea, unspecified: Secondary | ICD-10-CM

## 2023-03-29 NOTE — Telephone Encounter (Signed)
Left message to return call to schedule TCS. Dr.Castaneda ASA 2

## 2023-03-30 ENCOUNTER — Other Ambulatory Visit (INDEPENDENT_AMBULATORY_CARE_PROVIDER_SITE_OTHER): Payer: Self-pay | Admitting: Gastroenterology

## 2023-04-02 NOTE — Telephone Encounter (Signed)
Pt left voicemail return call to schedule TCS. Returned call to pt but had to leave message

## 2023-04-03 MED ORDER — PEG 3350-KCL-NA BICARB-NACL 420 G PO SOLR: 4000.0000 mL | Freq: Once | ORAL | 0 refills | Status: AC

## 2023-04-03 NOTE — Telephone Encounter (Signed)
Contacted pt. Pt scheduled for 04/25/23 at 7:30am. Instructions sent via mail. Prep sent to pharmacy.

## 2023-04-03 NOTE — Addendum Note (Signed)
Addended by: Marlowe Shores on: 04/03/2023 10:42 AM   Modules accepted: Orders

## 2023-04-23 ENCOUNTER — Other Ambulatory Visit (HOSPITAL_COMMUNITY)
Admission: RE | Admit: 2023-04-23 | Discharge: 2023-04-23 | Disposition: A | Discharge status: Home / Self Care | Payer: Medicare Other | Source: Ambulatory Visit | Attending: Gastroenterology | Admitting: Gastroenterology

## 2023-04-23 DIAGNOSIS — R9389 Abnormal findings on diagnostic imaging of other specified body structures: Secondary | ICD-10-CM | POA: Insufficient documentation

## 2023-04-23 DIAGNOSIS — R197 Diarrhea, unspecified: Secondary | ICD-10-CM | POA: Insufficient documentation

## 2023-04-23 LAB — BASIC METABOLIC PANEL
Anion gap: 11 (ref 5–15)
BUN: 17 mg/dL (ref 8–23)
CO2: 28 mmol/L (ref 22–32)
Calcium: 9.3 mg/dL (ref 8.9–10.3)
Chloride: 99 mmol/L (ref 98–111)
Creatinine, Ser: 0.71 mg/dL (ref 0.44–1.00)
GFR, Estimated: >60 mL/min (ref >60)
Glucose, Bld: 182 mg/dL — ABNORMAL HIGH (ref 70–99)
Potassium: 4.9 mmol/L (ref 3.5–5.1)
Sodium: 138 mmol/L (ref 135–145)

## 2023-04-24 NOTE — Anesthesia Preprocedure Evaluation (Signed)
Anesthesia Evaluation  Patient identified by MRN, date of birth, ID band Patient awake    Reviewed: Allergy & Precautions, NPO status , Patient's Chart, lab work & pertinent test results  History of Anesthesia Complications Negative for: history of anesthetic complications  Airway Mallampati: III  TM Distance: >3 FB Neck ROM: Full    Dental no notable dental hx. (+) Dental Advisory Given   Pulmonary sleep apnea , former smoker   Pulmonary exam normal breath sounds clear to auscultation (-) decreased breath sounds      Cardiovascular hypertension, Pt. on medications Normal cardiovascular exam Rhythm:Regular Rate:Normal     Neuro/Psych        ClaustrophobiaBack surgery  negative psych ROS   GI/Hepatic Neg liver ROS, hiatal hernia,GERD  ,,  Endo/Other  diabetes, Type 2  Class 3 obesity  Renal/GU negative Renal ROS  negative genitourinary   Musculoskeletal  (+) Arthritis , Osteoarthritis,    Abdominal  (+) + obese  Peds negative pediatric ROS (+)  Hematology negative hematology ROS (+)   Anesthesia Other Findings MRSA. Cancer. Claustrophobia  Reproductive/Obstetrics negative OB ROS                             Anesthesia Physical Anesthesia Plan  ASA: 3  Anesthesia Plan: General   Post-op Pain Management:    Induction: Intravenous  PONV Risk Score and Plan: Propofol infusion  Airway Management Planned: Nasal Cannula and Natural Airway  Additional Equipment: None  Intra-op Plan:   Post-operative Plan: Extubation in OR  Informed Consent: I have reviewed the patients History and Physical, chart, labs and discussed the procedure including the risks, benefits and alternatives for the proposed anesthesia with the patient or authorized representative who has indicated his/her understanding and acceptance.     Dental advisory given  Plan Discussed with: CRNA  Anesthesia Plan  Comments: ( )        Anesthesia Quick Evaluation

## 2023-04-25 ENCOUNTER — Encounter (INDEPENDENT_AMBULATORY_CARE_PROVIDER_SITE_OTHER): Payer: Self-pay | Admitting: *Deleted

## 2023-04-25 ENCOUNTER — Ambulatory Visit (HOSPITAL_COMMUNITY)
Admission: RE | Admit: 2023-04-25 | Discharge: 2023-04-25 | Disposition: A | Discharge status: Home / Self Care | Payer: Medicare Other | Attending: Gastroenterology | Admitting: Gastroenterology

## 2023-04-25 ENCOUNTER — Encounter (HOSPITAL_COMMUNITY)
Admission: RE | Disposition: A | Discharge status: Home / Self Care | Payer: Self-pay | Source: Home / Self Care | Attending: Gastroenterology

## 2023-04-25 ENCOUNTER — Ambulatory Visit (HOSPITAL_COMMUNITY): Payer: Medicare Other | Admitting: Anesthesiology

## 2023-04-25 ENCOUNTER — Encounter (HOSPITAL_COMMUNITY): Payer: Self-pay | Admitting: Gastroenterology

## 2023-04-25 ENCOUNTER — Ambulatory Visit (HOSPITAL_BASED_OUTPATIENT_CLINIC_OR_DEPARTMENT_OTHER): Payer: Self-pay | Admitting: Anesthesiology

## 2023-04-25 ENCOUNTER — Other Ambulatory Visit: Payer: Self-pay

## 2023-04-25 DIAGNOSIS — D126 Benign neoplasm of colon, unspecified: Secondary | ICD-10-CM

## 2023-04-25 DIAGNOSIS — R197 Diarrhea, unspecified: Secondary | ICD-10-CM

## 2023-04-25 DIAGNOSIS — K5730 Diverticulosis of large intestine without perforation or abscess without bleeding: Secondary | ICD-10-CM | POA: Insufficient documentation

## 2023-04-25 DIAGNOSIS — G4730 Sleep apnea, unspecified: Secondary | ICD-10-CM | POA: Insufficient documentation

## 2023-04-25 DIAGNOSIS — I10 Essential (primary) hypertension: Secondary | ICD-10-CM

## 2023-04-25 DIAGNOSIS — Z6839 Body mass index (BMI) 39.0-39.9, adult: Secondary | ICD-10-CM | POA: Insufficient documentation

## 2023-04-25 DIAGNOSIS — K5520 Angiodysplasia of colon without hemorrhage: Secondary | ICD-10-CM | POA: Diagnosis not present

## 2023-04-25 DIAGNOSIS — Z87891 Personal history of nicotine dependence: Secondary | ICD-10-CM | POA: Diagnosis not present

## 2023-04-25 DIAGNOSIS — Z79899 Other long term (current) drug therapy: Secondary | ICD-10-CM | POA: Diagnosis not present

## 2023-04-25 DIAGNOSIS — E66813 Obesity, class 3: Secondary | ICD-10-CM | POA: Insufficient documentation

## 2023-04-25 DIAGNOSIS — M1990 Unspecified osteoarthritis, unspecified site: Secondary | ICD-10-CM | POA: Diagnosis not present

## 2023-04-25 DIAGNOSIS — D122 Benign neoplasm of ascending colon: Secondary | ICD-10-CM | POA: Diagnosis not present

## 2023-04-25 DIAGNOSIS — Z7984 Long term (current) use of oral hypoglycemic drugs: Secondary | ICD-10-CM | POA: Insufficient documentation

## 2023-04-25 DIAGNOSIS — K529 Noninfective gastroenteritis and colitis, unspecified: Secondary | ICD-10-CM

## 2023-04-25 DIAGNOSIS — K449 Diaphragmatic hernia without obstruction or gangrene: Secondary | ICD-10-CM | POA: Insufficient documentation

## 2023-04-25 DIAGNOSIS — E119 Type 2 diabetes mellitus without complications: Secondary | ICD-10-CM | POA: Insufficient documentation

## 2023-04-25 DIAGNOSIS — E785 Hyperlipidemia, unspecified: Secondary | ICD-10-CM | POA: Insufficient documentation

## 2023-04-25 DIAGNOSIS — K644 Residual hemorrhoidal skin tags: Secondary | ICD-10-CM | POA: Insufficient documentation

## 2023-04-25 DIAGNOSIS — F40240 Claustrophobia: Secondary | ICD-10-CM | POA: Diagnosis not present

## 2023-04-25 DIAGNOSIS — K219 Gastro-esophageal reflux disease without esophagitis: Secondary | ICD-10-CM | POA: Diagnosis not present

## 2023-04-25 DIAGNOSIS — K648 Other hemorrhoids: Secondary | ICD-10-CM | POA: Insufficient documentation

## 2023-04-25 HISTORY — PX: COLONOSCOPY WITH PROPOFOL: SHX5780

## 2023-04-25 HISTORY — PX: BIOPSY: SHX5522

## 2023-04-25 HISTORY — PX: POLYPECTOMY: SHX149

## 2023-04-25 LAB — HM COLONOSCOPY

## 2023-04-25 LAB — GLUCOSE, CAPILLARY: Glucose-Capillary: 185 mg/dL — ABNORMAL HIGH (ref 70–99)

## 2023-04-25 SURGERY — COLONOSCOPY WITH PROPOFOL
Anesthesia: General

## 2023-04-25 MED ORDER — PROPOFOL 500 MG/50ML IV EMUL
INTRAVENOUS | Status: DC | PRN
  Administered 2023-04-25: 150 ug/kg/min via INTRAVENOUS

## 2023-04-25 MED ORDER — LACTATED RINGERS IV SOLN: INTRAVENOUS | Status: DC | PRN

## 2023-04-25 MED ORDER — STERILE WATER FOR IRRIGATION IR SOLN
Status: DC | PRN
  Administered 2023-04-25: 60 mL

## 2023-04-25 MED ORDER — MIDAZOLAM HCL 2 MG/2ML IJ SOLN
INTRAMUSCULAR | Status: AC
  Filled 2023-04-25: qty 2

## 2023-04-25 MED ORDER — MIDAZOLAM HCL 2 MG/2ML IJ SOLN
INTRAMUSCULAR | Status: DC | PRN
  Administered 2023-04-25: 2 mg via INTRAVENOUS

## 2023-04-25 MED ORDER — LIDOCAINE HCL (CARDIAC) PF 100 MG/5ML IV SOSY
PREFILLED_SYRINGE | INTRAVENOUS | Status: DC | PRN
  Administered 2023-04-25: 50 mg via INTRAVENOUS

## 2023-04-25 MED ORDER — PROPOFOL 10 MG/ML IV BOLUS
INTRAVENOUS | Status: DC | PRN
Start: 2023-04-25 — End: 2023-04-25
  Administered 2023-04-25: 50 mg via INTRAVENOUS
  Administered 2023-04-25: 100 mg via INTRAVENOUS

## 2023-04-25 NOTE — Anesthesia Postprocedure Evaluation (Signed)
Anesthesia Post Note  Patient: Ashlee Leblanc  Procedure(s) Performed: COLONOSCOPY WITH PROPOFOL POLYPECTOMY INTESTINAL BIOPSY  Patient location during evaluation: PACU Anesthesia Type: General Level of consciousness: awake and alert Pain management: pain level controlled Vital Signs Assessment: post-procedure vital signs reviewed and stable Respiratory status: spontaneous breathing, nonlabored ventilation, respiratory function stable and patient connected to nasal cannula oxygen Cardiovascular status: blood pressure returned to baseline and stable Postop Assessment: no apparent nausea or vomiting Anesthetic complications: no   There were no known notable events for this encounter.   Last Vitals:  Vitals:   04/25/23 0654 04/25/23 0856  BP: (!) 150/91 118/63  Pulse: 90 79  Resp: (!) 21 16  Temp: 36.7 C (!) 36.4 C  SpO2: 99% 99%    Last Pain:  Vitals:   04/25/23 0856  TempSrc: Axillary  PainSc: 0-No pain                 Bravery Ketcham L Lorisa Scheid

## 2023-04-25 NOTE — Op Note (Signed)
[facility] Patient Name: Ashlee Leblanc Procedure Date: 04/25/2023 7:08 AM MRN: [id number] Date of Birth: 1950/08/11 Attending MD: Katrinka Blazing , , [id number] CSN: [id number] Age: 72 Admit Type: Outpatient Procedure:                Colonoscopy Indications:              Clinically significant diarrhea of unexplained                            origin Providers:                Katrinka Blazing, Crystal Page, Francoise Ceo RN, RN,                            Dyann Ruddle Referring MD:              Medicines:                Monitored Anesthesia Care Complications:            No immediate complications. Estimated Blood Loss:     Estimated blood loss: none. Procedure:                Pre-Anesthesia Assessment:                           - Prior to the procedure, a History and Physical                            was performed, and patient medications, allergies                            and sensitivities were reviewed. The patient's                            tolerance of previous anesthesia was reviewed.                           - The risks and benefits of the procedure and the                            sedation options and risks were discussed with the                            patient. All questions were answered and informed                            consent was obtained.                           - ASA Grade Assessment: II - A patient with mild                            systemic disease.                           After obtaining informed consent, the colonoscope  was passed under direct vision. Throughout the                            procedure, the patient's blood pressure, pulse, and                            oxygen saturations were monitored continuously. The                            [id number] ([id number]) was introduced through the                            anus and advanced to the the cecum, identified by                             appendiceal orifice and ileocecal valve. The                            colonoscopy was performed without difficulty. The                            patient tolerated the procedure well. The quality                            of the bowel preparation was adequate. Scope In: 8:25:47 AM Scope Out: 8:53:52 AM Scope Withdrawal Time: 0 hours 23 minutes 8 seconds  Total Procedure Duration: 0 hours 28 minutes 5 seconds  Findings:      Two sessile polyps were found in the ascending colon. The polyps were 3       to 12 mm in size. These polyps were removed with a cold snare. Resection       and retrieval were complete.      A 2 mm polyp was found in the ascending colon. The polyp was sessile.       The polyp was removed with a cold biopsy forceps. Resection and       retrieval were complete.      A single medium-sized localized angiodysplastic lesion without bleeding       was found in the sigmoid colon.      Multiple medium-mouthed and small-mouthed diverticula were found in the       sigmoid colon and descending colon. Biopsies for histology were taken       from the normal colon with a cold forceps from the right colon and left       colon for evaluation of microscopic colitis.      Non-bleeding external and internal hemorrhoids were found during       retroflexion and during perianal exam. The hemorrhoids were moderate.      Hemorrhoids were found on perianal exam. Impression:               - Two 3 to 12 mm polyps in the ascending colon,                            removed with a cold snare. Resected and retrieved.                           -  One 2 mm polyp in the ascending colon, removed                            with a cold biopsy forceps. Resected and retrieved.                           - A single non-bleeding colonic angiodysplastic                            lesion.                           - Diverticulosis in the sigmoid colon and in the                            descending colon.  Biopsied.                           - Non-bleeding external and internal hemorrhoids.                           - Hemorrhoids found on perianal exam. Moderate Sedation:      Per Anesthesia Care Recommendation:           - Discharge patient to home (ambulatory).                           - Resume previous diet.                           - Await pathology results.                           - Repeat colonoscopy in 3 years for surveillance. Procedure Code(s):        --- Professional ---                           [phone], Colonoscopy, flexible; with removal of                            tumor(s), polyp(s), or other lesion(s) by snare                            technique                           45380, 59, Colonoscopy, flexible; with biopsy,                            single or multiple Diagnosis Code(s):        --- Professional ---                           D12.2, Benign neoplasm of ascending colon                           K55.20, Angiodysplasia of colon without hemorrhage  K64.8, Other hemorrhoids                           R19.7, Diarrhea, unspecified                           K57.30, Diverticulosis of large intestine without                            perforation or abscess without bleeding CPT copyright 2022 American Medical Association. All rights reserved. The codes documented in this report are preliminary and upon coder review may  be revised to meet current compliance requirements. Katrinka Blazing, MD Katrinka Blazing,  04/25/2023 9:00:44 AM This report has been signed electronically. Number of Addenda: 0

## 2023-04-25 NOTE — Anesthesia Procedure Notes (Signed)
Date/Time: 04/25/2023 8:21 AM  Performed by: Julian Reil, CRNAPre-anesthesia Checklist: Patient identified, Emergency Drugs available, Suction available and Patient being monitored Patient Re-evaluated:Patient Re-evaluated prior to induction Oxygen Delivery Method: Nasal cannula Induction Type: IV induction Placement Confirmation: positive ETCO2

## 2023-04-25 NOTE — Transfer of Care (Signed)
Immediate Anesthesia Transfer of Care Note  Patient: Ashlee Leblanc  Procedure(s) Performed: COLONOSCOPY WITH PROPOFOL POLYPECTOMY INTESTINAL BIOPSY  Patient Location: Endoscopy Unit  Anesthesia Type:General  Level of Consciousness: drowsy  Airway & Oxygen Therapy: Patient Spontanous Breathing  Post-op Assessment: Report given to RN and Post -op Vital signs reviewed and stable  Post vital signs: Reviewed and stable  Last Vitals:  Vitals Value Taken Time  BP    Temp    Pulse    Resp    SpO2      Last Pain:  Vitals:   04/25/23 0821  TempSrc:   PainSc: 2       Patients Stated Pain Goal: 6 (04/25/23 8295)  Complications: No notable events documented.

## 2023-04-25 NOTE — Discharge Instructions (Addendum)
You are being discharged to home.  Resume your previous diet.  We are waiting for your pathology results.  Your physician has recommended a repeat colonoscopy in three years for surveillance.  

## 2023-04-25 NOTE — Interval H&P Note (Signed)
History and Physical Interval Note:  04/25/2023 7:34 AM  Ashlee Leblanc  has presented today for surgery, with the diagnosis of DIARRHEA, RECENT COLITIS ON CT.  The various methods of treatment have been discussed with the patient and family. After consideration of risks, benefits and other options for treatment, the patient has consented to  Procedure(s) with comments: COLONOSCOPY WITH PROPOFOL (N/A) - 7:30AM;ASA 2 as a surgical intervention.  The patient's history has been reviewed, patient examined, no change in status, stable for surgery.  I have reviewed the patient's chart and labs.  Questions were answered to the patient's satisfaction.     Katrinka Blazing Mayorga

## 2023-04-26 LAB — SURGICAL PATHOLOGY

## 2023-05-01 ENCOUNTER — Encounter (HOSPITAL_COMMUNITY): Payer: Self-pay | Admitting: Gastroenterology

## 2023-05-01 ENCOUNTER — Encounter (INDEPENDENT_AMBULATORY_CARE_PROVIDER_SITE_OTHER): Payer: Self-pay | Admitting: *Deleted

## 2023-07-05 ENCOUNTER — Encounter (INDEPENDENT_AMBULATORY_CARE_PROVIDER_SITE_OTHER): Payer: Self-pay | Admitting: Gastroenterology

## 2023-07-30 ENCOUNTER — Ambulatory Visit (INDEPENDENT_AMBULATORY_CARE_PROVIDER_SITE_OTHER): Payer: Medicare Other | Admitting: Gastroenterology

## 2023-08-13 ENCOUNTER — Encounter (INDEPENDENT_AMBULATORY_CARE_PROVIDER_SITE_OTHER): Payer: Self-pay | Admitting: Gastroenterology

## 2023-08-13 ENCOUNTER — Ambulatory Visit (INDEPENDENT_AMBULATORY_CARE_PROVIDER_SITE_OTHER): Payer: Medicare Other | Admitting: Gastroenterology

## 2023-08-13 VITALS — BP 157/77 | HR 86 | Temp 97.2°F | Ht 66.0 in | Wt 257.0 lb

## 2023-08-13 DIAGNOSIS — R197 Diarrhea, unspecified: Secondary | ICD-10-CM

## 2023-08-13 DIAGNOSIS — R109 Unspecified abdominal pain: Secondary | ICD-10-CM

## 2023-08-13 DIAGNOSIS — R1030 Lower abdominal pain, unspecified: Secondary | ICD-10-CM

## 2023-08-13 DIAGNOSIS — K529 Noninfective gastroenteritis and colitis, unspecified: Secondary | ICD-10-CM

## 2023-08-13 MED ORDER — COLESTIPOL HCL 1 G PO TABS: 1.0000 g | ORAL_TABLET | Freq: Two times a day (BID) | ORAL | 1 refills | Status: DC

## 2023-08-13 NOTE — Progress Notes (Signed)
 Referring Provider: Garald Braver, FNP Primary Care Physician:  Garald Braver Primary GI Physician: Dr. Levon Hedger   Chief Complaint  Patient presents with   Diarrhea    Follow up on diarrhea and abdominal pain. Has it mostly every day. Taking hyoscyamine and not really helping.    HPI:   Ashlee Leblanc is a 73 y.o. female with past medical history of  GERD, diabetes, hiatal hernia, hyperlipidemia, hypertension   Patient presenting today for:  Follow up of diarrhea and abdominal pain   Last seen October 2024, at that time upwards of 6 stools per day.  Stools are looser and transition to watery.  Has fecal urgency and some incontinence.  Taking Levsin twice daily which helps with lessening symptoms.  Has some intermittent lower abdominal pain.  Does report she started paroxetine few weeks ago denies change in GI symptoms since this began.  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 Having 3-5 BMs per day most days, some days maybe a few less BMs. Stools range from loose then transitions to more watery. She notes that first stool of the day are more formed but still very soft. Most of the time after she eats she will have lower abdominal cramping about 15 minutes after eating. Seems to be more greasy/heavy foods that make her symptoms worse. She does note that defecation relieves her abdominal pain. She is using levsin BID for abdominal pain which helps some with her abdominal discomfort. She denies rectal bleeding but does endorse intermittent darker stools for a long time though denies black stools. She does endorse GB removal close to  8 years ago. diarrhea has been ongoing since prior to GB removal. She reports fecal urgency and sometimes has incontinence. She has actually gained some weight but reports appetite is not always good as she sometimes does not feel like eating. No nausea or vomiting. She does note in the past she was given a packet of powder medication she took twice a day, thinks this was something to do with her GB and this did slow her diarrhea but made her not have any BMs at all.    Pertinent workup: Stool studies: C. difficile and GI pathogen panel negative Abdominal x-ray: 02/01/2023 showed ileus versus early partial SBO CBC and CMP: Normal limits in August TSH: 01/2023 0.83 CT A/P with contrast No evidence of bowel obstruction. Areas of mild apparent wall thickening within the ascending colon, descending colon and sigmoid colon. These may be related to decompressed state/nondistention, but recommend clinical correlation to exclude colitis. Umbilical and supraumbilical hernias containing fat. Last Colonoscopy:04/2023 2 mm polyps in ascending colon, one 2 mm polyp in ascending colon with, single nonbleeding colonic angiodysplastic lesion, diverticulosis in sigmoid colon and descending colon, nonbleeding external and internal hemorrhoids -Biopsy: Tubular adenoma, no high-grade dysplasia or malignancy, random colonic biopsies with benign colonic mucosa with mild lamina propria edema, negative for acute inflammation, negative for dysplasia or malignancy Last Endoscopy:  Recommendations:    Filed Weights   08/13/23 1031  Weight: 257 lb (116.6 kg)     Past Medical History:  Diagnosis Date   Arthritis    Cancer (HCC)    polyp- 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   Claustrophobia    Complication of anesthesia    " hard time Knocking me out." "I wake up before I leave the OR."   Diabetes (HCC)    Type II   GERD (gastroesophageal reflux disease)    History of hiatal hernia    HLD (hyperlipidemia)    HTN  (hypertension)    Sleep apnea 2001   can't wear mask - claystrophbic    Past Surgical History:  Procedure Laterality Date   BACK SURGERY     BIOPSY  09/04/2019   Procedure: BIOPSY;  Surgeon: Malissa Hippo, MD;  [facility];  Service: Endoscopy;;  random colon   BIOPSY  04/25/2023   Procedure: BIOPSY;  Surgeon: Dolores Frame, MD;  Location: [facility];  Service: Gastroenterology;;   CHOLECYSTECTOMY  2015   COLONOSCOPY N/A 09/04/2019   Procedure: COLONOSCOPY;  Surgeon: Malissa Hippo, MD;  [facility]: [facility];  Service: Endoscopy;  Laterality: N/A;  1250   COLONOSCOPY WITH PROPOFOL N/A 04/25/2023   Procedure: COLONOSCOPY WITH PROPOFOL;  Surgeon: Dolores Frame, MD;  [facility]: [facility];  Service: Gastroenterology;  Laterality: N/A;  7:30AM;ASA 2   LUMBAR LAMINECTOMY/DECOMPRESSION MICRODISCECTOMY Right 04/02/2020   Procedure: Right Lumbar Three-Four Microdiscectomy;  Surgeon: Maeola Harman, MD;  Location: [facility] OR;  Service: Neurosurgery;  Laterality: Right;  posterior   Neuromoa Left    foot   PLANTAR FASCIA SURGERY Bilateral    POLYPECTOMY  04/25/2023   Procedure: POLYPECTOMY INTESTINAL;  Surgeon: Marguerita Merles, Reuel Boom, MD;  Location: [facility];  Service: Gastroenterology;;   REPLACEMENT TOTAL KNEE BILATERAL     ROTATOR CUFF REPAIR     bilateral at different dates   TRANSFORAMINAL LUMBAR INTERBODY FUSION (TLIF) WITH PEDICLE SCREW FIXATION 2 LEVEL Right 05/27/2020   Procedure: Right Lumbar Three-Four Lumbar Four-Five Transforaminal lumbar interbody fusion with Redo Decompression of Right Lumbar Three-Four;  Surgeon: Maeola Harman, MD;  Location: [facility] OR;  Service: Neurosurgery;  Laterality: Right;    Current Outpatient Medications  Medication Sig Dispense Refill   Cholecalciferol (VITAMIN D-3) 125 MCG (5000 UT) TABS Take 5,000 Units by mouth at bedtime.      enalapril (VASOTEC) 10 MG tablet Take 10 mg by mouth daily.     glipiZIDE  (GLUCOTROL XL) 5 MG 24 hr tablet Take 5 mg by mouth 2 (two) times daily.     hydrochlorothiazide (MICROZIDE) 12.5 MG capsule Take 12.5 mg by mouth daily.     hyoscyamine (LEVBID) 0.375 MG 12 hr tablet TAKE 1 TABLET (0.375 MG TOTAL) BY MOUTH EVERY 12 (TWELVE) HOURS AS NEEDED 180 tablet 1   metFORMIN (GLUCOPHAGE) 1000 MG tablet Take 1,000 mg by mouth 2 (two) times daily with a meal.     montelukast (SINGULAIR) 10 MG tablet Take 10 mg by mouth at bedtime.     nystatin-triamcinolone (MYCOLOG II) cream Apply 1 Application topically 3 (three) times daily. 60 g 3   omeprazole (PRILOSEC) 20 MG capsule Take 20 mg by mouth daily.     PARoxetine (PAXIL) 10 MG tablet Take 10 mg by mouth daily.     pioglitazone (ACTOS) 45 MG tablet Take 45 mg by mouth daily.     Probiotic Product (PROBIOTIC PO) Take 1 capsule by mouth daily.     simvastatin (ZOCOR) 40 MG tablet Take 40 mg by mouth every evening.     Specialty Vitamins Products (BRAIN PO) Take 1 tablet by mouth in the morning and at bedtime.  Cognium Brain Health Tablets     No current facility-administered medications for this visit.    Allergies as of 08/13/2023 - Review Complete 04/25/2023  Allergen Reaction Noted   Benadryl [diphenhydramine] Other (See Comments) 07/17/2019   Darvon [propoxyphene] Other (See Comments) 07/17/2019   Morphine and codeine Hives 07/17/2019   Tetracyclines & related Other (See Comments) 07/17/2019    Social History   Socioeconomic History   Marital status: Widowed    Spouse name: Not on file   Number of children: Not on file   Years of education: Not on file   Highest education level: Not on file  Occupational History   Not on file  Tobacco Use   Smoking status: Former    Types: Cigarettes    Passive exposure: Current   Smokeless tobacco: Never   Tobacco comments:    03/30/20- over 35 years ago  Vaping Use   Vaping status: Never Used  Substance and Sexual Activity   Alcohol use: Never   Drug use: Never    Sexual activity: Not on file  Other Topics Concern   Not on file  Social History Narrative   Not on file   Social Drivers of Health   Financial Resource Strain: Not on file  Food Insecurity: Not on file  Transportation Needs: Not on file  Physical Activity: Not on file  Stress: Not on file  Social Connections: Not on file    Review of systems General: negative for malaise, night sweats, fever, chills, weight loss Neck: Negative for lumps, goiter, pain and significant neck swelling Resp: Negative for cough, wheezing, dyspnea at rest CV: Negative for chest pain, leg swelling, palpitations, orthopnea GI: denies melena, hematochezia, nausea, vomiting, constipation, dysphagia, odyonophagia, early satiety or unintentional weight loss. +diarrhea +lower abdominal pain  The remainder of the review of systems is noncontributory.  Physical Exam: BP (!) 157/77   Pulse 86   Temp (!) 97.2 F (36.2 C) (Oral)   Ht 5\' 6"  (1.676 m)   Wt 257 lb (116.6 kg)   BMI 41.48 kg/m  General:   Alert and oriented. No distress noted. Pleasant and cooperative.  Head:  Normocephalic and atraumatic. Eyes:  Conjuctiva clear without scleral icterus. Mouth:  Oral mucosa pink and moist. Good dentition. No lesions. Heart: Normal rate and rhythm, s1 and s2 heart sounds present.  Lungs: Clear lung sounds in all lobes. Respirations equal and unlabored. Abdomen:  +BS, soft, non-tender and non-distended. No rebound or guarding. No HSM or masses noted. Derm: No palmar erythema or jaundice Msk:  Symmetrical without gross deformities. Normal posture. Extremities:  Without edema. Neurologic:  Alert and  oriented x4 Psych:  Alert and cooperative. Normal mood and affect.  Invalid input(s): "6 MONTHS"   ASSESSMENT: Ashlee Leblanc is a 73 y.o. female presenting today for follow-up of diarrhea and abdominal pain  Patient with history of IBS, chronic diarrhea for years though worse over the past few months.  Recent  colonoscopy as outlined above was grossly unremarkable other than a few polyps.  She does report today that diarrhea likely worsened after her gallbladder removal 8 years ago.  She was tried on a medication in the past which sounds to be possibly cholestyramine but she notes stopped her diarrhea but actually constipated her to the point that she was having no bowel movements.  Suspect there may be a combination of irritable bowel syndrome and some bile acid diarrhea at play here given her abdominal discomfort as well.  For now we will continue with Levsin twice daily as needed, provide low FODMAP food guide which I discussed with the patient and will trial colestipol 1 g twice daily to see if lower dose of bile acid sequestrant is more tolerated.   PLAN:  -low FODMAP -colestipol 1g BID -Continue levsin Twice daily  All questions were answered, patient verbalized understanding and is in agreement with plan as outlined above.   Follow Up: 4 months   Nikya Busler L. Jeanmarie Hubert, MSN, APRN, AGNP-C Adult-Gerontology Nurse Practitioner [facility]  I have reviewed the note and agree with the APP's assessment as described in this progress note  Katrinka Blazing, MD Gastroenterology and Hepatology [facility]astroenter[facility]

## 2023-08-13 NOTE — Patient Instructions (Signed)
 We will start colestipol 1g twice daily to help with your diarrhea, we are starting with a low dose but may need to adjust this depending how you do You can continue levsin as needed for abdominal pain I am also providing the low FODMAP food guide to help figure out foods that may cause worsening diarrhea  Follow up 4 months  It was a pleasure to see you today. I want to create trusting relationships with patients and provide genuine, compassionate, and quality care. I truly value your feedback! please be on the lookout for a survey regarding your visit with me today. I appreciate your input about our visit and your time in completing this!    Reena Borromeo L. Jeanmarie Hubert, MSN, APRN, AGNP-C Adult-Gerontology Nurse Practitioner [facility] Gastroenterology at [facility]

## 2023-08-20 ENCOUNTER — Telehealth (INDEPENDENT_AMBULATORY_CARE_PROVIDER_SITE_OTHER): Payer: Self-pay | Admitting: *Deleted

## 2023-08-20 NOTE — Telephone Encounter (Signed)
 Took one Tuesday night and on Wednesday, thurdsay and Friday took one bid.

## 2023-08-20 NOTE — Telephone Encounter (Signed)
 Started colestipol on tuesday night. Had small bm Wednesday none on Thursday and Friday. Stopped med.  Saturday evening had a couple small BM and yesterday two stools that were half way normal. Wanted to let you know she cannot take colestipol because it made her constipated.   [phone].

## 2023-08-21 NOTE — Telephone Encounter (Signed)
 Left message to return call

## 2023-08-22 NOTE — Telephone Encounter (Signed)
 Pt called back and left vm to give her a call back on [phone]. Tried to call no answer

## 2023-08-22 NOTE — Telephone Encounter (Signed)
 Discussed with patient per chelsea can try to take one every other day.

## 2023-09-05 ENCOUNTER — Other Ambulatory Visit (INDEPENDENT_AMBULATORY_CARE_PROVIDER_SITE_OTHER): Payer: Self-pay | Admitting: Gastroenterology

## 2023-09-05 NOTE — Telephone Encounter (Signed)
 Tried calling-no answer.

## 2023-09-06 NOTE — Telephone Encounter (Signed)
 I called and left a message asked that the patient please return call to the office.

## 2023-09-07 NOTE — Telephone Encounter (Signed)
 I spoke with the patient and she says she takes this every other day.

## 2023-09-24 ENCOUNTER — Telehealth (INDEPENDENT_AMBULATORY_CARE_PROVIDER_SITE_OTHER): Payer: Self-pay

## 2023-09-24 NOTE — Telephone Encounter (Signed)
 Patient called today says she is having 7-8 Loose stools per day, on going for the last week to ten days.She says she was previously on Colestid daily initially, then she says she was told to use it every other day, but she has been taking it daily since last Thursday and it has not made much of a difference. She says after eating with in minutes, no matter what she eats she starts having abdominal cramps and has to run to the restroom and have a Bm, which is loose and has a consistency of "jelly". She says she has not had any Levsin as she was instructed by the pharmacist to not take the Levsin the days she takes the Colestid. Patient thought at first she had a gi virus, but now she does not think it is. She denies any fever, bloody or dark colored stools, nor does she think the stools have a foul odor. Last seen 08/13/2023 by Joie Narrow. I advised that Joie Narrow was not in the office today, but would be back in the office tomorrow and patient was fine with this. Please advise.

## 2023-09-25 NOTE — Telephone Encounter (Signed)
 I spoke with the patient and made her aware per [facility],  She can try adding some imodium to help with her diarrhea, though if this is persisting, would recommend she come in for a visit  Patient states understanding.

## 2023-09-25 NOTE — Telephone Encounter (Signed)
 I spoke with the patient she says she had not been in contact with anyone whom was sick, and she has not been started on any new medications, including antibiotics.

## 2023-09-26 NOTE — Telephone Encounter (Signed)
 Patient called back to the office and says she took the imodium four times yesterday, but the diarrhea did not stop. She says she is having issues with cramping and diarrhea every time she eats, or drinks.She says she is using the Hyoscyamine as needed and it helps, but not much. She says she was also told by pharmacy that she could not take it with the colestipol. She says she was told to take the colestipol every other day. Please advise. Thanks,

## 2023-09-27 NOTE — Telephone Encounter (Signed)
Noted. Thanks,

## 2023-09-27 NOTE — Telephone Encounter (Signed)
 I spoke with the patient and made her aware that [facility] suggest the patient come in for a visit. Patient states understanding and I transferred her to Amalia Badder to schedule.

## 2023-10-02 ENCOUNTER — Encounter: Payer: Self-pay | Admitting: Gastroenterology

## 2023-10-02 ENCOUNTER — Ambulatory Visit (INDEPENDENT_AMBULATORY_CARE_PROVIDER_SITE_OTHER): Admitting: Gastroenterology

## 2023-10-02 VITALS — BP 101/63 | HR 78 | Temp 97.1°F | Ht 66.0 in | Wt 239.0 lb

## 2023-10-02 DIAGNOSIS — R197 Diarrhea, unspecified: Secondary | ICD-10-CM

## 2023-10-02 DIAGNOSIS — R634 Abnormal weight loss: Secondary | ICD-10-CM | POA: Diagnosis not present

## 2023-10-02 DIAGNOSIS — K529 Noninfective gastroenteritis and colitis, unspecified: Secondary | ICD-10-CM | POA: Diagnosis not present

## 2023-10-02 DIAGNOSIS — R109 Unspecified abdominal pain: Secondary | ICD-10-CM

## 2023-10-02 DIAGNOSIS — R1030 Lower abdominal pain, unspecified: Secondary | ICD-10-CM

## 2023-10-02 NOTE — Patient Instructions (Signed)
 Please complete stool samples and blood work.  As soon as this is resulted, we can have a plan of action!  Please go to the emergency room if feeling dehydrated, worsening pain or symptoms!  It was a pleasure to see you today. I want to create trusting relationships with patients and provide genuine, compassionate, and quality care. I truly value your feedback, so please be on the lookout for a survey regarding your visit with me today. I appreciate your time in completing this!         Delman Ferns, PhD, ANP-BC [facility] Gastroenterology

## 2023-10-02 NOTE — Progress Notes (Addendum)
 Gastroenterology Office Note     Primary Care Physician:  Wilhemena Harbour  Primary Gastroenterologist: Dr. Sammi Crick    Chief Complaint   Chief Complaint  Patient presents with   Diarrhea    Follow up on diarrhea. Having it daily. Yesterday had 5 crackers and a little bit of mountain dew and had about 10 episodes of diarrhea. States she has lost 12 pounds in past two and a half weeks. 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 Having abdominal cramping.      History of Present Illness   Ashlee Leblanc is a 73 y.o. female presenting today with a history of GERD, chronic diarrhea, now with worsening diarrhea from baseline. She was last seen 08/13/23 and prescribed colestipol  1 g BID due to suspicion for bile salt diarrhea/IBS.   Celiac serologies negative in 2021. Negative colonic biopsies 2024.    Diarrhea intermittently since 2012. Cholecystectomy 2015 without much difference in bowel habits. Her baseline bowel habits are 4 loose stools a day, milkshake consistency, soft. Levsin Has been on Levsin in the past and taking this week without improvement. Tried taking colestipol  BID and stopped her up. Then went to once every other day but then started having diarrhea that was refractory to colestipol  once every other day. No rectal bleeding. Stools dark yellow. Looks greasy. In past has had more urgency of loose stools and some cramps, but this is new. Colestipol  backs her up. Questran historically has stopped her up after one teaspoon. Throws up pepto bismol. Imodium without improvement.   For past 2. 5 weeks, when drinking anything or eating anything, will have postprandial abdominal cramping and diarrhea. She has lost about 12 lbs. Stool flies out but not a lot. Last few days eating crackers. No foul odor. No sick contacts. No recent antibiotics. No fever. Stays chilly.   She has been on metformin  chronically. Dosage has remained same.   In March 2025  was 257, today 239. Baseline over past yea was mid 240s.   Pertinent workup: Stool studies: C. difficile and GI pathogen panel negative Abdominal x-ray: 02/01/2023 showed ileus versus early partial SBO CBC and CMP: Normal limits in August TSH: 01/2023 0.83 CT A/P with contrast No evidence of bowel obstruction. Areas of mild apparent wall thickening within the ascending colon, descending colon and sigmoid colon. These may be related to decompressed state/nondistention, but recommend clinical correlation to exclude colitis. Umbilical and supraumbilical hernias containing fat. Last Colonoscopy:04/2023 2 mm polyps in ascending colon, one 2 mm polyp in ascending colon with, single nonbleeding colonic angiodysplastic lesion, diverticulosis in sigmoid colon and descending colon, nonbleeding external and internal hemorrhoids -Biopsy: Tubular adenoma, no high-grade dysplasia or malignancy, random colonic biopsies with benign colonic mucosa with mild lamina propria edema, negative for acute inflammation, negative for dysplasia or malignancy Last Endoscopy:   Mother with history of UC   Past Medical History:  Diagnosis Date   Arthritis    Cancer (HCC)    polyp- colon- contained in polyp.   Claustrophobia    Complication of anesthesia    " hard time Knocking me out." "I wake up before I leave the OR."   Diabetes (HCC)    Type II   GERD (gastroesophageal reflux disease)    History of hiatal hernia    HLD (hyperlipidemia)    HTN (hypertension)    Sleep apnea 2001   can't wear mask - claystrophbic    Past Surgical History:  Procedure Laterality Date   BACK SURGERY     BIOPSY  09/04/2019   Procedure: BIOPSY;  Surgeon: Ruby Corporal, MD;  Location: [facility];  Service: Endoscopy;;  random colon   BIOPSY  04/25/2023   Procedure: BIOPSY;  Surgeon: Urban Garden, MD;  Location: [facility];  Service: Gastroenterology;;   CHOLECYSTECTOMY  2015   COLONOSCOPY N/A 09/04/2019    Procedure: COLONOSCOPY;  Surgeon: Ruby Corporal, MD;  Location: [facility];  Service: Endoscopy;  Laterality: N/A;  1250   COLONOSCOPY WITH PROPOFOL  N/A 04/25/2023   Procedure: COLONOSCOPY WITH PROPOFOL ;  Surgeon: Urban Garden, MD;  Location: [facility];  Service: Gastroenterology;  Laterality: N/A;  7:30AM;ASA 2   LUMBAR LAMINECTOMY/DECOMPRESSION MICRODISCECTOMY Right 04/02/2020   Procedure: Right Lumbar Three-Four Microdiscectomy;  Surgeon: Manya Sells, MD;  Location: [facility] OR;  Service: Neurosurgery;  Laterality: Right;  posterior   Neuromoa Left    foot   PLANTAR FASCIA SURGERY Bilateral    POLYPECTOMY  04/25/2023   Procedure: POLYPECTOMY INTESTINAL;  Surgeon: Umberto Ganong, Bearl Limes, MD;  Location: [facility];  Service: Gastroenterology;;   REPLACEMENT TOTAL KNEE BILATERAL     ROTATOR CUFF REPAIR     bilateral at different dates   TRANSFORAMINAL LUMBAR INTERBODY FUSION (TLIF) WITH PEDICLE SCREW FIXATION 2 LEVEL Right 05/27/2020   Procedure: Right Lumbar Three-Four Lumbar Four-Five Transforaminal lumbar interbody fusion with Redo Decompression of Right Lumbar Three-Four;  Surgeon: Manya Sells, MD;  Location: [facility] OR;  Service: Neurosurgery;  Laterality: Right;    Current Outpatient Medications  Medication Sig Dispense Refill   colestipol  (COLESTID ) 1 g tablet Take 1 tablet (1 g total) by mouth every other day. 60 tablet 2   enalapril  (VASOTEC ) 10 MG tablet Take 10 mg by mouth daily.     glipiZIDE  (GLUCOTROL  XL) 5 MG 24 hr tablet Take 5 mg by mouth 2 (two) times daily.     hydrochlorothiazide  (MICROZIDE ) 12.5 MG capsule Take 12.5 mg by mouth daily.     hyoscyamine  (LEVBID) 0.375 MG 12 hr tablet TAKE 1 TABLET (0.375 MG TOTAL) BY MOUTH EVERY 12 (TWELVE) HOURS AS NEEDED 180 tablet 1   metFORMIN  (GLUCOPHAGE ) 1000 MG tablet Take 1,000 mg by mouth 2 (two) times daily with a meal.     montelukast  (SINGULAIR ) 10 MG tablet Take 10 mg by mouth at bedtime.      nystatin -triamcinolone  (MYCOLOG II) cream Apply 1 Application topically 3 (three) times daily. 60 g 3   omeprazole (PRILOSEC) 20 MG capsule Take 20 mg by mouth daily.     PARoxetine (PAXIL) 10 MG tablet Take 10 mg by mouth daily.     pioglitazone  (ACTOS ) 45 MG tablet Take 45 mg by mouth daily.     Probiotic Product (PROBIOTIC PO) Take 1 capsule by mouth daily.     simvastatin  (ZOCOR ) 40 MG tablet Take 40 mg by mouth every evening.     Specialty Vitamins Products (BRAIN PO) Take 1 tablet by mouth in the morning and at bedtime. Cognium Brain Health Tablets     Cholecalciferol  (VITAMIN D -3) 125 MCG (5000 UT) TABS Take 5,000 Units by mouth at bedtime.  (Patient not taking: Reported on 10/02/2023)     No current facility-administered medications for this visit.    Allergies as of 10/02/2023 - Review Complete 10/02/2023  Allergen Reaction Noted   Benadryl [diphenhydramine] Other (See Comments) 07/17/2019   Darvon [propoxyphene] Other (See Comments) 07/17/2019   Morphine and codeine Hives  07/17/2019   Tetracyclines & related Other (See Comments) 07/17/2019    Family History  Problem Relation Age of Onset   Colon cancer Brother     Social History   Socioeconomic History   Marital status: Widowed    Spouse name: Not on file   Number of children: Not on file   Years of education: Not on file   Highest education level: Not on file  Occupational History   Not on file  Tobacco Use   Smoking status: Former    Types: Cigarettes    Passive exposure: Current   Smokeless tobacco: Never   Tobacco comments:    03/30/20- over 35 years ago  Vaping Use   Vaping status: Never Used  Substance and Sexual Activity   Alcohol use: Never   Drug use: Never   Sexual activity: Not on file  Other Topics Concern   Not on file  Social History Narrative   Not on file   Social Drivers of Health   Financial Resource Strain: Not on file  Food Insecurity: Not on file  Transportation Needs: Not on file   Physical Activity: Not on file  Stress: Not on file  Social Connections: Not on file  Intimate Partner Violence: Not on file     Review of Systems   Gen: Denies any fever, chills, fatigue, weight loss, lack of appetite.  CV: Denies chest pain, heart palpitations, peripheral edema, syncope.  Resp: Denies shortness of breath at rest or with exertion. Denies wheezing or cough.  GI: Denies dysphagia or odynophagia. Denies jaundice, hematemesis, fecal incontinence. GU : Denies urinary burning, urinary frequency, urinary hesitancy MS: Denies joint pain, muscle weakness, cramps, or limitation of movement.  Derm: Denies rash, itching, dry skin Psych: Denies depression, anxiety, memory loss, and confusion Heme: Denies bruising, bleeding, and enlarged lymph nodes.   Physical Exam   BP 101/63   Pulse 78   Temp (!) 97.1 F (36.2 C)   Ht 5\' 6"  (1.676 m)   Wt 239 lb (108.4 kg)   BMI 38.58 kg/m  General:   Alert and oriented. Pleasant and cooperative. Well-nourished and well-developed.  Head:  Normocephalic and atraumatic. Eyes:  Without icterus Abdomen:  +BS, soft, non-tender and non-distended. No HSM noted. No guarding or rebound. No masses appreciated.  Rectal:  Deferred  Msk:  Symmetrical without gross deformities. Normal posture. Extremities:  Without edema. Neurologic:  Alert and  oriented x4;  grossly normal neurologically. Skin:  Intact without significant lesions or rashes. Psych:  Alert and cooperative. Normal mood and affect.   Assessment   Ashlee Leblanc is a 73 y.o. female presenting today with a history of GERD, chronic diarrhea, now with worsening diarrhea from baseline.    Acute on chronic diarrhea: thus far with celiac serologies negative in 2021, negative colonic biopsies in late 2024, colestid  BID overshooting the mark and causing more constipation. Now with numerous watery stools  for past 2.5 weeks and associated weight loss of about 12 lbs. Need to rule out  infectious etiology and will also order inflammatory markers and fecal calprotectin. 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 Doubt chronic mesenteric ischemia but with her postprandial abdominal pain and weight loss, low threshold for CTA.      PLAN    CBC, CMP, celiac serologies, sed rate, CRP, GI path panel, Cdiff, fecal cal CTA If persistent abdominal pain, weight loss, negative serologies and stool tests Can also consider trial of Xifaxan  If no improvement with  Xifaxan  and negative CTA, consider pancreatic enzymes as at risk for EPI but would not explain acute on chronic symptoms at this point.    Delman Ferns, PhD, ANP-BC [facility]   I have reviewed the note and agree with the APP's assessment as described in this progress note  Samantha Cress, MD Gastroenterology and Hepatology [facility]

## 2023-10-04 LAB — SEDIMENTATION RATE: Sed Rate: 9 mm/h (ref 0–40)

## 2023-10-04 LAB — COMPREHENSIVE METABOLIC PANEL WITH GFR
ALT: 11 IU/L (ref 0–32)
AST: 16 IU/L (ref 0–40)
Albumin: 4.6 g/dL (ref 3.8–4.8)
Alkaline Phosphatase: 78 IU/L (ref 44–121)
BUN/Creatinine Ratio: 12 (ref 12–28)
BUN: 12 mg/dL (ref 8–27)
Bilirubin Total: 1.8 mg/dL — ABNORMAL HIGH (ref 0.0–1.2)
CO2: 24 mmol/L (ref 20–29)
Calcium: 9.3 mg/dL (ref 8.7–10.3)
Chloride: 97 mmol/L (ref 96–106)
Creatinine, Ser: 0.98 mg/dL (ref 0.57–1.00)
Globulin, Total: 2.3 g/dL (ref 1.5–4.5)
Glucose: 164 mg/dL — ABNORMAL HIGH (ref 70–99)
Potassium: 3.4 mmol/L — ABNORMAL LOW (ref 3.5–5.2)
Sodium: 141 mmol/L (ref 134–144)
Total Protein: 6.9 g/dL (ref 6.0–8.5)
eGFR: 62 mL/min/{1.73_m2} (ref >59)

## 2023-10-04 LAB — CBC WITH DIFFERENTIAL/PLATELET
Basophils Absolute: 0 10*3/uL (ref 0.0–0.2)
Basos: 0 %
EOS (ABSOLUTE): 0 10*3/uL (ref 0.0–0.4)
Eos: 1 %
Hematocrit: 45.1 % (ref 34.0–46.6)
Hemoglobin: 14.8 g/dL (ref 11.1–15.9)
Immature Grans (Abs): 0 10*3/uL (ref 0.0–0.1)
Immature Granulocytes: 0 %
Lymphocytes Absolute: 1.3 10*3/uL (ref 0.7–3.1)
Lymphs: 21 %
MCH: 31.1 pg (ref 26.6–33.0)
MCHC: 32.8 g/dL (ref 31.5–35.7)
MCV: 95 fL (ref 79–97)
Monocytes Absolute: 0.5 10*3/uL (ref 0.1–0.9)
Monocytes: 8 %
Neutrophils Absolute: 4.5 10*3/uL (ref 1.4–7.0)
Neutrophils: 70 %
Platelets: 215 10*3/uL (ref 150–450)
RBC: 4.76 x10E6/uL (ref 3.77–5.28)
RDW: 12.3 % (ref 11.7–15.4)
WBC: 6.4 10*3/uL (ref 3.4–10.8)

## 2023-10-04 LAB — TISSUE TRANSGLUTAMINASE, IGA

## 2023-10-04 LAB — IGA: IgA/Immunoglobulin A, Serum: 110 mg/dL (ref 64–422)

## 2023-10-04 LAB — C-REACTIVE PROTEIN: CRP: 1 mg/L (ref 0–10)

## 2023-10-05 LAB — CLOSTRIDIUM DIFFICILE EIA: C difficile Toxins A+B, EIA: NEGATIVE

## 2023-10-06 LAB — GI PROFILE, STOOL, PCR

## 2023-10-06 LAB — CALPROTECTIN, FECAL: Calprotectin, Fecal: 92 ug/g (ref 0–120)

## 2023-10-08 ENCOUNTER — Telehealth (INDEPENDENT_AMBULATORY_CARE_PROVIDER_SITE_OTHER): Payer: Self-pay | Admitting: *Deleted

## 2023-10-08 DIAGNOSIS — R1030 Lower abdominal pain, unspecified: Secondary | ICD-10-CM

## 2023-10-08 DIAGNOSIS — R110 Nausea: Secondary | ICD-10-CM

## 2023-10-08 DIAGNOSIS — R9389 Abnormal findings on diagnostic imaging of other specified body structures: Secondary | ICD-10-CM

## 2023-10-08 DIAGNOSIS — R197 Diarrhea, unspecified: Secondary | ICD-10-CM

## 2023-10-08 NOTE — Telephone Encounter (Signed)
 Patient seen on 10/02/23 and called lab results.   [phone]

## 2023-10-11 MED ORDER — RIFAXIMIN 550 MG PO TABS: 550.0000 mg | ORAL_TABLET | Freq: Three times a day (TID) | ORAL | 0 refills | Status: AC

## 2023-10-11 NOTE — Telephone Encounter (Signed)
 All results are normal. No infection on stool studies. Inflammatory markers normal (sed rate, CRP, fecal cal). Negative celiac. She has mildly low potassium at 3.4. Make sure eating potassium-rich foods.   Is she still having abdominal pain and cramping after eating? If so, we need to arrange a CT angiogram as soon as possible to evaluate blood flow in intestines. She has had notable weight loss.  I am also sending in Xifaxan , which she will take TID for 14 days. This is for IBS with diarrhea.   If she has any worsening symptoms or not able to stay hydrated or feels weak, please go to the ED.   She can take Colestid  one tablet once each day and monitor for constipation. I know she was taking every other day and still had diarrhea. In the past, she had taken it BID but this caused constipation.

## 2023-10-11 NOTE — Addendum Note (Signed)
 Addended by: Feliz Hosteller on: 10/11/2023 04:46 PM   Modules accepted: Orders

## 2023-10-11 NOTE — Telephone Encounter (Signed)
 Anna/ Mindy  patient is still having abdominal pain and cramping after eating and would like to go ahead and set up CT angiogram as soon as possible.   Discussed with patient per Antony Baumgartner -  All results are normal. No infection on stool studies. Inflammatory markers normal (sed rate, CRP, fecal cal). Negative celiac. She has mildly low potassium at 3.4. Make sure eating potassium-rich foods.    Is she still having abdominal pain and cramping after eating? If so, we need to arrange a CT angiogram as soon as possible to evaluate blood flow in intestines. She has had notable weight loss.   I am also sending in Xifaxan , which she will take TID for 14 days. This is for IBS with diarrhea.    If she has any worsening symptoms or not able to stay hydrated or feels weak, please go to the ED.    She can take Colestid  one tablet once each day and monitor for constipation. I know she was taking every other day and still had diarrhea. In the past, she had taken it BID but this caused constipation.   Patient verbalized understanding.       Note

## 2023-10-12 NOTE — Telephone Encounter (Signed)
 Pt aware of CTA appt

## 2023-10-12 NOTE — Telephone Encounter (Signed)
 PA approved via [employer] Auth# [id number] DOS: 10/12/23-04/09/24

## 2023-10-17 ENCOUNTER — Telehealth: Payer: Self-pay

## 2023-10-17 NOTE — Telephone Encounter (Signed)
 PA done on Cover My Meds for Xifaxan  (appeal). Dx used: IBS-D, tried/failed: levsin, colestipol , questran, imodium, and pepto bismol. Waiting on a response which can take up to 5 days.

## 2023-10-18 ENCOUNTER — Ambulatory Visit (HOSPITAL_COMMUNITY)
Admission: RE | Admit: 2023-10-18 | Discharge: 2023-10-18 | Disposition: A | Discharge status: Home / Self Care | Source: Ambulatory Visit | Attending: Gastroenterology | Admitting: Gastroenterology

## 2023-10-18 DIAGNOSIS — R110 Nausea: Secondary | ICD-10-CM | POA: Diagnosis present

## 2023-10-18 DIAGNOSIS — R197 Diarrhea, unspecified: Secondary | ICD-10-CM | POA: Insufficient documentation

## 2023-10-18 DIAGNOSIS — R9389 Abnormal findings on diagnostic imaging of other specified body structures: Secondary | ICD-10-CM | POA: Diagnosis present

## 2023-10-18 DIAGNOSIS — R1030 Lower abdominal pain, unspecified: Secondary | ICD-10-CM | POA: Diagnosis present

## 2023-10-18 MED ORDER — IOHEXOL 350 MG/ML SOLN
100.0000 mL | Freq: Once | INTRAVENOUS | Status: AC | PRN
  Administered 2023-10-18: 100 mL via INTRAVENOUS

## 2023-10-19 NOTE — Telephone Encounter (Signed)
 Approval through [employer] for Xifaxan  Tab from 06/13/2023-10/31/2023. As long as the pt keeps this insurance and continues to keep this medication she will be approved. Approval letter scanned to chart. Pharmacy and pt advised.

## 2023-10-23 ENCOUNTER — Ambulatory Visit: Payer: Self-pay | Admitting: Gastroenterology

## 2023-10-23 NOTE — Progress Notes (Signed)
 So glad to hear! I will see her next week.

## 2023-11-01 ENCOUNTER — Ambulatory Visit (INDEPENDENT_AMBULATORY_CARE_PROVIDER_SITE_OTHER): Admitting: Gastroenterology

## 2023-11-01 ENCOUNTER — Encounter: Payer: Self-pay | Admitting: Gastroenterology

## 2023-11-01 VITALS — BP 138/76 | HR 75 | Temp 97.3°F | Ht 66.0 in | Wt 237.4 lb

## 2023-11-01 DIAGNOSIS — R634 Abnormal weight loss: Secondary | ICD-10-CM

## 2023-11-01 DIAGNOSIS — K529 Noninfective gastroenteritis and colitis, unspecified: Secondary | ICD-10-CM | POA: Diagnosis not present

## 2023-11-01 DIAGNOSIS — R197 Diarrhea, unspecified: Secondary | ICD-10-CM

## 2023-11-01 MED ORDER — ZENPEP 60000-189600 UNITS PO CPEP: 60000.0000 [IU] | ORAL_CAPSULE | Freq: Three times a day (TID) | ORAL | 3 refills | Status: AC

## 2023-11-01 NOTE — Patient Instructions (Signed)
 Let's continue the course of Xifaxan .  Please have alpha gal panel done when you are able.   When done, let's start Zenpep 1 capsule three times a day WITH FOOD. Take while you are eating. We can increase this if needed. I have sent to your pharmacy.  We will see you back in 6 weeks!  I enjoyed seeing you again today! I value our relationship and want to provide genuine, compassionate, and quality care. You may receive a survey regarding your visit with me, and I welcome your feedback! Thanks so much for taking the time to complete this. I look forward to seeing you again.      Delman Ferns, PhD, ANP-BC [facility]

## 2023-11-01 NOTE — Progress Notes (Addendum)
 Gastroenterology Office Note     Primary Care Physician:  Wilhemena Harbour  Primary Gastroenterologist: Dr. Sammi Crick    Chief Complaint   Chief Complaint  Patient presents with   Follow-up    Patient here today for a follow up on diarrhea. Patient says she still is having issues with diarrhea, while she is still taking Xifaxan  tid. She says she has a few more days worth of medication to take. Patient not taking colestipol . She does use the Levbid 0.375 Q 12 hours prn.      History of Present Illness   Ashlee Leblanc is a 73 y.o. female presenting today with a history of GERD, chronic diarrhea, last seen April 2025 with acute on chronic diarrhea from baseline. Suspicion for bile salt diarrhea/IBS historically.   Celiac serologies negative in 2021. Negative colonic biopsies 2024.   Prescribed colestid  in March 2025, but this caused constipation if taking BID. Questran has caused constipation in past even at very low doses. Imodium without improvement. Dicyclomine  in past without improvement she believes.   As she had postprandial abdominal pain, cramping, weight loss when last seen, we checked CTA. CTA: celiac, SMA, IMA patent. Fecal cal mildly elevated. Xifaxan  prescribed. Possible colitis on CT vs underdistension. Stool studies were negative April 2025 for infectious process. Fecal cal borderline at 92. Updated celiac serologies negative. CRP and sed rate normal.   Xifaxan  course almost complete. Consider PERT if needed as at risk for EPI. Wasn't able to get Xifaxan  until last Monday, has about 5 days left. Little over half of course completed. Had mild improvement in cramping. Not as bad. May not happen for a few days and then have a few days of cramping. Levbid just on days with cramping. Diarrhea improved with less frequency, still with bad days and has cramping. Appetite is not good. Postprandial urgency. 15 min to 30 minutes later will have urgency. No fever. Gets chilly at  times. Steatorrhea, more gas.   Not taking colestipol . She wants to stay away from this even if we adjusted to just once daily.   Weight: 2 lbs down from April visit.  In March 2025 was 257, April 239. Today 237. Baseline over past year was mid 240s.    Pertinent workup: Stool studies: C. difficile and GI pathogen panel negative on several occasions CTA May 2025: no evidence for chronic mesenteric ischemia. Colitis vs underdistension (suspect underdistended).  Abdominal x-ray: 02/01/2023 showed ileus versus early partial SBO CBC and CMP: Normal limits in August TSH: 01/2023 0.83 CT A/P with contrast No evidence of bowel obstruction. Areas of mild apparent wall thickening within the ascending colon, descending colon and sigmoid colon. These may be related to decompressed state/nondistention, but recommend clinical correlation to exclude colitis. Umbilical and supraumbilical hernias containing fat. 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   Claustrophobia    Complication of anesthesia    " hard time Knocking me out." "I wake  up before I leave the OR."   Diabetes (HCC)    Type II   GERD (gastroesophageal reflux disease)    History of hiatal hernia    HLD (hyperlipidemia)    HTN (hypertension)    Sleep apnea 2001   can't wear mask - claystrophbic    Past Surgical History:  Procedure Laterality Date   BACK  SURGERY     BIOPSY  09/04/2019   Procedure: BIOPSY;  Surgeon: Ruby Corporal, MD;  [facility];  Service: Endoscopy;;  random colon   BIOPSY  04/25/2023   Procedure: BIOPSY;  Surgeon: Urban Garden, MD;  [facility];  Service: Gastroenterology;;   CHOLECYSTECTOMY  2015   COLONOSCOPY N/A 09/04/2019   Procedure: COLONOSCOPY;  Surgeon: Ruby Corporal, MD;  [facility];  Service: Endoscopy;  Laterality: N/A;  1250   COLONOSCOPY WITH PROPOFOL  N/A 04/25/2023   Procedure: COLONOSCOPY WITH PROPOFOL ;  Surgeon: Urban Garden, MD;  [facility];  Service: Gastroenterology;  Laterality: N/A;  7:30AM;ASA 2   LUMBAR LAMINECTOMY/DECOMPRESSION MICRODISCECTOMY Right 04/02/2020   Procedure: Right Lumbar Three-Four Microdiscectomy;  Surgeon: Manya Sells, MD;  Location: [facility] OR;  Service: Neurosurgery;  Laterality: Right;  posterior   Neuromoa Left    foot   PLANTAR FASCIA SURGERY Bilateral    POLYPECTOMY  04/25/2023   Procedure: POLYPECTOMY INTESTINAL;  Surgeon: Umberto Ganong, Bearl Limes, MD;  [facility];  Service: Gastroenterology;;   REPLACEMENT TOTAL KNEE BILATERAL     ROTATOR CUFF REPAIR     bilateral at different dates   TRANSFORAMINAL LUMBAR INTERBODY FUSION (TLIF) WITH PEDICLE SCREW FIXATION 2 LEVEL Right 05/27/2020   Procedure: Right Lumbar Three-Four Lumbar Four-Five Transforaminal lumbar interbody fusion with Redo Decompression of Right Lumbar Three-Four;  Surgeon: Manya Sells, MD;  Location: [facility] OR;  Service: Neurosurgery;  Laterality: Right;    Current Outpatient Medications  Medication Sig Dispense Refill   enalapril  (VASOTEC ) 10 MG tablet Take 10 mg by mouth daily.     glipiZIDE  (GLUCOTROL  XL) 5 MG 24 hr tablet Take 5 mg by mouth 2 (two) times daily.     hydrochlorothiazide  (MICROZIDE ) 12.5 MG capsule Take 12.5 mg by mouth daily.     hyoscyamine  (LEVBID) 0.375 MG 12 hr tablet TAKE 1 TABLET (0.375 MG TOTAL)  BY MOUTH EVERY 12 (TWELVE) HOURS AS NEEDED 180 tablet 1   metFORMIN  (GLUCOPHAGE ) 1000 MG tablet Take 1,000 mg by mouth 2 (two) times daily with a meal.     montelukast  (SINGULAIR ) 10 MG tablet Take 10 mg by mouth at bedtime.     nystatin -triamcinolone  (MYCOLOG II) cream Apply 1 Application topically 3 (three) times daily. 60 g 3   omeprazole (PRILOSEC) 20 MG capsule Take 20 mg by mouth daily.     PARoxetine (PAXIL) 10 MG tablet Take 10 mg by mouth daily.     pioglitazone  (ACTOS ) 45 MG tablet Take 45 mg by mouth daily.     Probiotic Product (PROBIOTIC PO) Take 1 capsule by mouth daily.     simvastatin  (ZOCOR ) 40 MG tablet Take 40 mg by mouth every evening.     Specialty Vitamins Products (BRAIN PO) Take 1 tablet by mouth in the morning and at bedtime. [employer] Brain Health Tablets     colestipol  (COLESTID ) 1 g tablet Take 1 tablet (1 g total) by mouth every other day. (Patient not taking: Reported on 11/01/2023) 60 tablet 2   No current facility-administered medications for this  visit.    Allergies as of 11/01/2023 - Review Complete 11/01/2023  Allergen Reaction Noted   Benadryl [diphenhydramine] Other (See Comments) 07/17/2019   Darvon [propoxyphene] Other (See Comments) 07/17/2019   Morphine and codeine Hives 07/17/2019   Tetracyclines & related Other (See Comments) 07/17/2019    Family History  Problem Relation Age of Onset   Colon cancer Brother     Social History   Socioeconomic History   Marital status: Widowed    Spouse name: Not on file   Number of children: Not on file   Years of education: Not on file   Highest education level: Not on file  Occupational History   Not on file  Tobacco Use   Smoking status: Former    Types: Cigarettes    Passive exposure: Current   Smokeless tobacco: Never   Tobacco comments:    03/30/20- over 35 years ago  Vaping Use   Vaping status: Never Used  Substance and Sexual Activity   Alcohol use: Never   Drug use: Never   Sexual  activity: Not on file  Other Topics Concern   Not on file  Social History Narrative   Not on file   Social Drivers of Health   Financial Resource Strain: Not on file  Food Insecurity: Not on file  Transportation Needs: Not on file  Physical Activity: Not on file  Stress: Not on file  Social Connections: Not on file  Intimate Partner Violence: Not on file     Review of Systems   See HPI   Physical Exam   BP 138/76 (BP Location: Left Arm, Patient Position: Sitting, Cuff Size: Large)   Pulse 75   Temp (!) 97.3 F (36.3 C) (Temporal)   Ht 5\' 6"  (1.676 m)   Wt 237 lb 6.4 oz (107.7 kg)   BMI 38.32 kg/m  General:   Alert and oriented. Pleasant and cooperative. Well-nourished and well-developed.  Head:  Normocephalic and atraumatic. Eyes:  Without icterus Abdomen:  +BS, soft, non-tender and non-distended. No HSM noted. No guarding or rebound. No masses appreciated.  Rectal:  Deferred  Msk:  Symmetrical without gross deformities. Normal posture. Extremities:  Without edema. Neurologic:  Alert and  oriented x4;  grossly normal neurologically. Skin:  Intact without significant lesions or rashes. Psych:  Alert and cooperative. Normal mood and affect.   Assessment   Ashlee Leblanc is a 73 y.o. female presenting today with a history of  GERD, chronic diarrhea, last seen April 2025 with acute on chronic diarrhea from baseline. Suspicion for bile salt diarrhea/IBS historically.   Acute on chronic diarrhea: with negative stool tests, CTA completed due to weight loss and persistent diarrhea but no evidence for chronic mesenteric ischemia. Prior evaluation for diarrhea as noted above and suspect multifactorial in setting of IBS, bile salt diarrhea, and increased suspicion for EPI underlying. She has noted improvement on a course of Xifaxan  with about half the course completed, which is encouraging. She would like to avoid Colestid  going forward due to constipation.   Weight loss: 2  lbs down from April visit. She had marked decrease from March 2025 at 257 down to April 2025 for acute visit at 239. Appears may be nearing a plateau and will follow this closely. As noted, CTA negative.     PLAN    Complete course of Xifaxan  May continue Levbid prn cramping Alpha gal panel to be thorough When she has COMPLETED the course of Xifaxan , will trial Zenpep  60k  with meals (so as not to cloud clinical picture on what intervention is helping). This was sent to pharmacy.  6 week close follow-up for weight check and evaluate symptoms   Delman Ferns, PhD, ANP-BC [facility] Gastroenterology   I have reviewed the note and agree with the APP's assessment as described in this progress note  Samantha Cress, MD Gastroenterology and Hepatology [facility]astroenter[facility]

## 2023-11-26 LAB — ALPHA-GAL PANEL
Allergen Lamb IgE: <0.1 kU/L
Beef IgE: <0.1 kU/L
IgE (Immunoglobulin E), Serum: 26 [IU]/mL (ref 6–495)
O215-IgE Alpha-Gal: <0.1 kU/L
Pork IgE: <0.1 kU/L

## 2023-12-03 ENCOUNTER — Ambulatory Visit: Payer: Self-pay | Admitting: Gastroenterology

## 2023-12-13 ENCOUNTER — Encounter: Payer: Self-pay | Admitting: Gastroenterology

## 2023-12-13 ENCOUNTER — Ambulatory Visit: Admitting: Gastroenterology

## 2023-12-13 VITALS — BP 135/70 | HR 71 | Temp 98.2°F | Ht 66.0 in | Wt 240.2 lb

## 2023-12-13 DIAGNOSIS — R195 Other fecal abnormalities: Secondary | ICD-10-CM | POA: Diagnosis not present

## 2023-12-13 DIAGNOSIS — K529 Noninfective gastroenteritis and colitis, unspecified: Secondary | ICD-10-CM

## 2023-12-13 NOTE — Patient Instructions (Signed)
 Continue Zenpep  with meals.  Start taking Colestid  1 tablet each morning.  Please call by the last week of July with an update. If no improvement, we will set up the capsule study!  We will see you in 6-8 weeks.  I enjoyed seeing you again today! I value our relationship and want to provide genuine, compassionate, and quality care. You may receive a survey regarding your visit with me, and I welcome your feedback! Thanks so much for taking the time to complete this. I look forward to seeing you again.      Therisa MICAEL Stager, PhD, ANP-BC [facility] Gastroenterology

## 2023-12-13 NOTE — Progress Notes (Signed)
 Gastroenterology Office Note     Primary Care Physician:  Viktoria Lawrnce BRAVO  Primary Gastroenterologist: Dr. Eartha    Chief Complaint   Chief Complaint  Patient presents with   Follow-up    States that she was doing well when first starting the enzymes but is now going back to how it was in the beginning with having diarrhea. Is going 4 to 5 times a day sometimes more.     History of Present Illness   Ashlee Leblanc is a 73 y.o. female presenting today with a history of GERD, chronic diarrhea, now with worsening diarrhea from baseline , last seen May 2025. Has had suspicion for bile salt diarrhea/IBS historically.   Due to abdominal pain, cramping, weight loss, CTA completed with patent vasculature. Fecal cal mildly elevated. Xifaxan  prescribed. Possible colitis on CT vs underdistension. Stool studies were negative April 2025 for infectious process. Fecal cal borderline at 92. Updated celiac serologies negative. CRP and sed rate normal.   She has completed course of Xifaxan  with mild improvement. Started on PERT at last visit in May 2025: Zenpep  60k with meals. Alpha gal panel negative in interim from last visit.   Weights: March 2025 was 257, April 239. May 237. Today 240. Baseline over past year was mid 240s.    Today: Notes with Zenpep  she had less frequent stools and could anticipate better when she would need to have BM. Stool back to watery, oil slick. Looks like when cooking a ham and juices congeal. Cramping had stopped with Zenpep  but now back. Just had to start back taking taking Levbid  last week, so she did see some improvement with Zenpep . Not taking colestipol .   Was having diarrhea even prior to cholecystectomy. Cholecystectomy in 2015. No worsening after cholecystectomy.   Has previously been on hyosycamine that worked about a year then was not effective. She has been on metformin  chronically. Dosage has remained same. Questran caused significant constipation.  Colestipol  BID caused constipation. Tried every other day.   Pertinent workup: Stool studies: C. difficile and GI pathogen panel negative on several occasions CTA May 2025: no evidence for chronic mesenteric ischemia. Colitis vs underdistension (suspect underdistended).  CTA completed with patent vasculature. Fecal cal mildly elevated. Xifaxan  prescribed. Possible colitis on CT vs underdistension. Stool studies were negative April 2025 for infectious process. Fecal cal borderline at 92. Updated celiac serologies negative. CRP and sed rate normal. Abdominal x-ray: 02/01/2023 showed ileus versus early partial SBO CBC and CMP: Normal limits in August TSH: 01/2023 0.83 CT A/P with contrast: Aug 2024 No evidence of bowel obstruction. Areas of mild apparent wall thickening within the ascending colon, descending colon and sigmoid colon. These may be related to decompressed state/nondistention, but recommend clinical correlation to exclude colitis. Umbilical and supraumbilical hernias containing fat. 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   Claustrophobia    Complication of anesthesia     hard time Knocking me out. I wake up before I leave the OR.  Diabetes (HCC)    Type II   GERD (gastroesophageal reflux disease)    History of hiatal hernia    HLD (hyperlipidemia)    HTN (hypertension)    Sleep apnea 2001   can't wear mask - claystrophbic     Past Surgical History:  Procedure Laterality Date   BACK SURGERY     BIOPSY  09/04/2019   Procedure: BIOPSY;  Surgeon: Golda Claudis PENNER, MD;  [facility];  Service: Endoscopy;;  random colon   BIOPSY  04/25/2023   Procedure: BIOPSY;  Surgeon: Eartha Angelia Sieving, MD;  [facility];  Service: Gastroenterology;;   CHOLECYSTECTOMY  2015   COLONOSCOPY N/A 09/04/2019   Procedure: COLONOSCOPY;  Surgeon: Golda Claudis PENNER, MD;  [facility];  Service: Endoscopy;  Laterality: N/A;  1250   COLONOSCOPY WITH PROPOFOL  N/A 04/25/2023   Procedure: COLONOSCOPY WITH PROPOFOL ;  Surgeon: Eartha Angelia Sieving, MD;  [facility];  Service: Gastroenterology;  Laterality: N/A;  7:30AM;ASA 2   LUMBAR LAMINECTOMY/DECOMPRESSION MICRODISCECTOMY Right 04/02/2020   Procedure: Right Lumbar Three-Four Microdiscectomy;  Surgeon: Unice Pac, MD;  Location: [facility] OR;  Service: Neurosurgery;  Laterality: Right;  posterior   Neuromoa Left    foot   PLANTAR FASCIA SURGERY Bilateral    POLYPECTOMY  04/25/2023   Procedure: POLYPECTOMY INTESTINAL;  Surgeon: Eartha Angelia, Sieving, MD;  [facility];  Service: Gastroenterology;;   REPLACEMENT TOTAL KNEE BILATERAL     ROTATOR CUFF REPAIR     bilateral at different dates   TRANSFORAMINAL LUMBAR INTERBODY FUSION (TLIF) WITH PEDICLE SCREW FIXATION 2 LEVEL Right 05/27/2020   Procedure: Right Lumbar Three-Four Lumbar Four-Five Transforaminal lumbar interbody fusion with Redo Decompression of Right Lumbar Three-Four;  Surgeon: Unice Pac, MD;  Location: [facility] OR;  Service: Neurosurgery;  Laterality: Right;    Current Outpatient Medications  Medication Sig Dispense Refill   buPROPion (WELLBUTRIN XL) 150 MG 24 hr tablet Take 150 mg by mouth every morning.     enalapril  (VASOTEC ) 10 MG tablet Take 10 mg by mouth daily.     glipiZIDE  (GLUCOTROL  XL) 5 MG 24 hr tablet Take 5 mg by mouth 2 (two) times daily.      hydrochlorothiazide  (MICROZIDE ) 12.5 MG capsule Take 12.5 mg by mouth daily.     hyoscyamine  (LEVBID ) 0.375 MG 12 hr tablet TAKE 1 TABLET (0.375 MG TOTAL) BY MOUTH EVERY 12 (TWELVE) HOURS AS NEEDED 180 tablet 1   metFORMIN  (GLUCOPHAGE ) 1000 MG tablet Take 1,000 mg by mouth 2 (two) times daily with a meal.     montelukast  (SINGULAIR ) 10 MG tablet Take 10 mg by mouth at bedtime.     nystatin -triamcinolone  (MYCOLOG II) cream Apply 1 Application topically 3 (three) times daily. 60 g 3   omeprazole (PRILOSEC) 20 MG capsule Take 20 mg by mouth daily.     Pancrelipase , Lip-Prot-Amyl, (ZENPEP ) 60000-189600 units CPEP Take 60,000 Units by mouth 3 (three) times daily with meals. 90 capsule 3   PARoxetine (PAXIL) 10 MG tablet Take 10 mg by mouth daily.     pioglitazone  (ACTOS ) 45 MG tablet Take 45 mg by mouth daily.     Probiotic Product (PROBIOTIC PO) Take 1 capsule by mouth daily.     simvastatin  (ZOCOR ) 40 MG tablet Take 40 mg by mouth every evening.     Specialty Vitamins Products (BRAIN PO) Take 1 tablet by mouth in the morning and at bedtime. [employer] Brain Health Tablets  No current facility-administered medications for this visit.    Allergies as of 12/13/2023 - Review Complete 12/13/2023  Allergen Reaction Noted   Benadryl [diphenhydramine] Other (See Comments) 07/17/2019   Darvon [propoxyphene] Other (See Comments) 07/17/2019   Morphine and codeine Hives 07/17/2019   Tetracyclines & related Other (See Comments) 07/17/2019    Family History  Problem Relation Age of Onset   Colon cancer Brother     Social History   Socioeconomic History   Marital status: Widowed    Spouse name: Not on file   Number of children: Not on file   Years of education: Not on file   Highest education level: Not on file  Occupational History   Not on file  Tobacco Use   Smoking status: Former    Types: Cigarettes    Passive exposure: Current   Smokeless tobacco: Never   Tobacco comments:     03/30/20- over 35 years ago  Vaping Use   Vaping status: Never Used  Substance and Sexual Activity   Alcohol use: Never   Drug use: Never   Sexual activity: Not on file  Other Topics Concern   Not on file  Social History Narrative   Not on file   Social Drivers of Health   Financial Resource Strain: Not on file  Food Insecurity: Not on file  Transportation Needs: Not on file  Physical Activity: Not on file  Stress: Not on file  Social Connections: Not on file  Intimate Partner Violence: Not on file     Review of Systems   Gen: Denies any fever, chills, fatigue, weight loss, lack of appetite.  CV: Denies chest pain, heart palpitations, peripheral edema, syncope.  Resp: Denies shortness of breath at rest or with exertion. Denies wheezing or cough.  GI: Denies dysphagia or odynophagia. Denies jaundice, hematemesis, fecal incontinence. GU : Denies urinary burning, urinary frequency, urinary hesitancy MS: Denies joint pain, muscle weakness, cramps, or limitation of movement.  Derm: Denies rash, itching, dry skin Psych: Denies depression, anxiety, memory loss, and confusion Heme: Denies bruising, bleeding, and enlarged lymph nodes.   Physical Exam   BP 135/70 (BP Location: Right Arm, Patient Position: Sitting, Cuff Size: Large)   Pulse 71   Temp 98.2 F (36.8 C) (Oral)   Ht 5' 6 (1.676 m)   Wt 240 lb 3.2 oz (109 kg)   SpO2 95%   BMI 38.77 kg/m  General:   Alert and oriented. Pleasant and cooperative. Well-nourished and well-developed.  Head:  Normocephalic and atraumatic. Eyes:  Without icterus Abdomen:  +BS, soft, non-tender and non-distended. No HSM noted. No guarding or rebound. No masses appreciated.  Rectal:  Deferred  Msk:  Symmetrical without gross deformities. Normal posture. Extremities:  Without edema. Neurologic:  Alert and  oriented x4;  grossly normal neurologically. Skin:  Intact without significant lesions or rashes. Psych:  Alert and cooperative.  Normal mood and affect.   Assessment   Ashlee Leblanc is a 73 y.o. female presenting today with a history of GERD, chronic diarrhea, now with worsening diarrhea from baseline, abdominal pain,  last seen May 2025. Has had suspicion for bile salt diarrhea/IBS historically. Returning for early interval follow-up.  Acute on chronic diarrhea: extensive evaluation as noted above. Suspect colitis seen on prior imaging due to underdistension. CTA patent vasculature, inflammatory serologies negative but fecal cal mildly elevated, colonoscopy last year without microscopic colitis. Prior therapies noted above and noting improvement with Xifaxan  although mild, PERT improvement wonderfully initially  but then less efficacy, unintentional weight loss as noted with weights but improved since last visit by 3 lbs. Questran overshot the mark, colestid  BID constipating as well. She does report mother with hx of UC. Due to findings of possible colitis on imaging X 2, reviewed with Dr. Eartha consideration of early interval colonoscopy. At this time, since colonoscopy so recent, will hold off on this. She will continue Zenpep  with meals, as she noted improvement with this. Will also trial Colestid  just one tablet each morning to hopefully avoid constipation. IF she has no improvement, we will arrange capsule study to rule out small bowel etiology.   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 Doubt dealing with a NET. Unable to exclude metformin  playing a role, although she has been on this chronically. Anecdotally, I have seen patients improve with discontinuation of metformin  even if this had been chronically taken without issues before.      PLAN    Continue Zenpep  60k with meals Start Colestid   just once each morning Call last week of July with update: if no improvement, arranging capsule study Return in 6-8 weeks Consider drug holiday off metformin , as anecdotally I have had patients improve with diarrhea even if metformin  had been taking long-term without side effects in the past.    Therisa MICAEL Stager, PhD, ANP-BC [facility] Gastroenterology   I have reviewed the note and agree with the APP's assessment as described in this progress note  Toribio Eartha, MD Gastroenterology and Hepatology [facility] Gastroenterology

## 2024-01-31 ENCOUNTER — Ambulatory Visit: Admitting: Gastroenterology

## 2024-03-13 ENCOUNTER — Encounter: Payer: Self-pay | Admitting: Gastroenterology

## 2024-03-13 ENCOUNTER — Ambulatory Visit (INDEPENDENT_AMBULATORY_CARE_PROVIDER_SITE_OTHER): Admitting: Gastroenterology

## 2024-03-13 VITALS — BP 124/79 | HR 68 | Temp 98.0°F | Ht 65.5 in | Wt 245.4 lb

## 2024-03-13 DIAGNOSIS — Z860101 Personal history of adenomatous and serrated colon polyps: Secondary | ICD-10-CM | POA: Diagnosis not present

## 2024-03-13 DIAGNOSIS — K529 Noninfective gastroenteritis and colitis, unspecified: Secondary | ICD-10-CM | POA: Diagnosis not present

## 2024-03-13 NOTE — Progress Notes (Addendum)
 Gastroenterology Office Note     Primary Care Physician:  Viktoria Lawrnce BRAVO  Primary Gastroenterologist: Dr. Eartha    Chief Complaint   Chief Complaint  Patient presents with   Follow-up    States that the effectiveness of the colestipol  has lessened. Takes one pill every 3 days currently and states her stools are a little harder to begin with then second day they are a little more soft.      History of Present Illness   Ashlee Leblanc is a 73 y.o. female presenting today with a history ofGERD, chronic diarrhea,  worsening diarrhea from baseline in April 2025 , last seen July  2025. Has had suspicion for bile salt diarrhea/IBS historically.   Due to abdominal pain, cramping, weight loss, CTA completed with patent vasculature. Fecal cal mildly elevated. Xifaxan  prescribed with just mild improvement. Possible colitis on CT vs underdistension. Stool studies were negative April 2025 for infectious process. Fecal cal borderline at 92. Updated celiac serologies negative. CRP and sed rate normal. Alpha gal panel negative   She has noted some improvement with Zenpep . Mild improvement with Xifaxan  initially then worsened. Other therapies including hyoscyamine  that lost efficacy, questran with constipation, colestipol  BID caused constipation. Diarrhea history: even prior to cholecystectomy.   Weights: March 2025 was 257, April 239. May 237. July 240, today 245.  Baseline over past year was mid 240s.   At last visit, I recommended continuing Zenpep  and just take Colestid  once each morning instead of BID. Capsule study if no improvement, consider drug holiday off metformin .    TODAY: States every other day colestipol  still caused constipation, so now taking every 3rd day. Now having a BM every day. On the first day of taking colestipol  may have a little more harder stool. If she misses taking that third day, she will go back to diarrhea. Zenpep  TID. Is she misses taking the colestipol ,  may take levbid  but then eases off after starting colestipol .   She will ask about Dr Viktoria about drug holiday with metformin .    Pertinent workup: Stool studies: C. difficile and GI pathogen panel negative on several occasions CTA May 2025: no evidence for chronic mesenteric ischemia. Colitis vs underdistension (suspect underdistended).  CTA completed with patent vasculature. Fecal cal mildly elevated. Xifaxan  prescribed. Possible colitis on CT vs underdistension. Stool studies were negative April 2025 for infectious process. Fecal cal borderline at 92. Updated celiac serologies negative. CRP and sed rate normal. Abdominal x-ray: 02/01/2023 showed ileus versus early partial SBO CBC and CMP: Normal limits in August TSH: 01/2023 0.83 CT A/P with contrast: Aug 2024 No evidence of bowel obstruction. Areas of mild apparent wall thickening within the ascending colon, descending colon and sigmoid colon. These may be related to decompressed state/nondistention, but recommend clinical correlation to exclude colitis. Umbilical and supraumbilical hernias containing fat. Last Colonoscopy:04/2023 2 mm polyps in ascending colon, one 2 mm polyp in ascending colon with, single nonbleeding colonic angiodysplastic lesion, diverticulosis in sigmoid colon and descending colon, nonbleeding external and internal hemorrhoids -Biopsy: Tubular adenoma, no high-grade dysplasia or malignancy, random colonic biopsies with benign colonic mucosa with mild lamina propria edema, negative for acute inflammation, negative for dysplasia or malignancy Last Endoscopy: n/a   Past Medical History:  Diagnosis Date   Arthritis    Cancer (HCC)    polyp- colon- contained in polyp.   Claustrophobia    Complication of anesthesia     hard time Knocking me out. I wake up  before I leave the OR.   Diabetes (HCC)    Type II   GERD (gastroesophageal reflux disease)    History of hiatal hernia    HLD (hyperlipidemia)    HTN  (hypertension)    Sleep apnea 2001   can't wear mask - claystrophbic    Past Surgical History:  Procedure Laterality Date   BACK SURGERY     BIOPSY  09/04/2019   Procedure: BIOPSY;  Surgeon: Golda Claudis PENNER, MD;  Location: [facility];  Service: Endoscopy;;  random colon   BIOPSY  04/25/2023   Procedure: BIOPSY;  Surgeon: Eartha Angelia Sieving, MD;  Location: [facility];  Service: Gastroenterology;;   CHOLECYSTECTOMY  2015   COLONOSCOPY N/A 09/04/2019   Procedure: COLONOSCOPY;  Surgeon: Golda Claudis PENNER, MD;  Location: [facility];  Service: Endoscopy;  Laterality: N/A;  1250   COLONOSCOPY WITH PROPOFOL  N/A 04/25/2023   Procedure: COLONOSCOPY WITH PROPOFOL ;  Surgeon: Eartha Angelia Sieving, MD;  Location: [facility];  Service: Gastroenterology;  Laterality: N/A;  7:30AM;ASA 2   LUMBAR LAMINECTOMY/DECOMPRESSION MICRODISCECTOMY Right 04/02/2020   Procedure: Right Lumbar Three-Four Microdiscectomy;  Surgeon: Unice Pac, MD;  Location: [facility] OR;  Service: Neurosurgery;  Laterality: Right;  posterior   Neuromoa Left    foot   PLANTAR FASCIA SURGERY Bilateral    POLYPECTOMY  04/25/2023   Procedure: POLYPECTOMY INTESTINAL;  Surgeon: Eartha Angelia, Sieving, MD;  Location: [facility];  Service: Gastroenterology;;   REPLACEMENT TOTAL KNEE BILATERAL     ROTATOR CUFF REPAIR     bilateral at different dates   TRANSFORAMINAL LUMBAR INTERBODY FUSION (TLIF) WITH PEDICLE SCREW FIXATION 2 LEVEL Right 05/27/2020   Procedure: Right Lumbar Three-Four Lumbar Four-Five Transforaminal lumbar interbody fusion with Redo Decompression of Right Lumbar Three-Four;  Surgeon: Unice Pac, MD;  Location[facility];  Service: Neurosurgery;  Laterality: Right;    Current Outpatient Medications  Medication Sig Dispense Refill   buPROPion (WELLBUTRIN XL) 150 MG 24 hr tablet Take 150 mg by mouth every morning.     colestipol  (COLESTID ) 1 g tablet Take 1 g by mouth every 3 (three) days.      enalapril  (VASOTEC ) 10 MG tablet Take 10 mg by mouth daily.     glipiZIDE  (GLUCOTROL  XL) 5 MG 24 hr tablet Take 5 mg by mouth 2 (two) times daily.     hydrochlorothiazide  (MICROZIDE ) 12.5 MG capsule Take 12.5 mg by mouth daily.     hyoscyamine  (LEVBID ) 0.375 MG 12 hr tablet TAKE 1 TABLET (0.375 MG TOTAL) BY MOUTH EVERY 12 (TWELVE) HOURS AS NEEDED 180 tablet 1   metFORMIN  (GLUCOPHAGE ) 1000 MG tablet Take 1,000 mg by mouth 2 (two) times daily with a meal.     montelukast  (SINGULAIR ) 10 MG tablet Take 10 mg by mouth at bedtime.     nystatin -triamcinolone  (MYCOLOG II) cream Apply 1 Application topically 3 (three) times daily. 60 g 3   omeprazole (PRILOSEC) 20 MG capsule Take 20 mg by mouth daily.     Pancrelipase , Lip-Prot-Amyl, (ZENPEP ) 60000-189600 units CPEP Take 60,000 Units by mouth 3 (three) times daily with meals. 90 capsule 3   PARoxetine (PAXIL) 10 MG tablet Take 10 mg by mouth daily.     pioglitazone  (ACTOS ) 45 MG tablet Take 45 mg by mouth daily.     Probiotic Product (PROBIOTIC PO) Take 1 capsule by mouth daily.     simvastatin  (ZOCOR ) 40 MG tablet Take 40 mg by mouth every evening.  Specialty Vitamins Products (BRAIN PO) Take 1 tablet by mouth in the morning and at bedtime. Cognium Brain Health Tablets     No current facility-administered medications for this visit.    Allergies as of 03/13/2024 - Review Complete 03/13/2024  Allergen Reaction Noted   Benadryl [diphenhydramine] Other (See Comments) 07/17/2019   Darvon [propoxyphene] Other (See Comments) 07/17/2019   Morphine and codeine Hives 07/17/2019   Tetracyclines & related Other (See Comments) 07/17/2019    Family History  Problem Relation Age of Onset   Colon cancer Brother     Social History   Socioeconomic History   Marital status: Widowed    Spouse name: Not on file   Number of children: Not on file   Years of education: Not on file   Highest education level: Not on file  Occupational History   Not on file   Tobacco Use   Smoking status: Former    Types: Cigarettes    Passive exposure: Current   Smokeless tobacco: Never   Tobacco comments:    03/30/20- over 35 years ago  Vaping Use   Vaping status: Never Used  Substance and Sexual Activity   Alcohol use: Never   Drug use: Never   Sexual activity: Not on file  Other Topics Concern   Not on file  Social History Narrative   Not on file   Social Drivers of Health   Financial Resource Strain: Not on file  Food Insecurity: Not on file  Transportation Needs: Not on file  Physical Activity: Not on file  Stress: Not on file  Social Connections: Not on file  Intimate Partner Violence: Not on file     Review of Systems   Gen: Denies any fever, chills, fatigue, weight loss, lack of appetite.  CV: Denies chest pain, heart palpitations, peripheral edema, syncope.  Resp: Denies shortness of breath at rest or with exertion. Denies wheezing or cough.  GI: Denies dysphagia or odynophagia. Denies jaundice, hematemesis, fecal incontinence. GU : Denies urinary burning, urinary frequency, urinary hesitancy MS: Denies joint pain, muscle weakness, cramps, or limitation of movement.  Derm: Denies rash, itching, dry skin Psych: Denies depression, anxiety, memory loss, and confusion Heme: Denies bruising, bleeding, and enlarged lymph nodes.   Physical Exam   BP 124/79 (BP Location: Right Arm, Patient Position: Sitting, Cuff Size: Large)   Pulse 68   Temp 98 F (36.7 C) (Oral)   Ht 5' 5.5 (1.664 m)   Wt 245 lb 6.4 oz (111.3 kg)   SpO2 98%   BMI 40.22 kg/m  General:   Alert and oriented. Pleasant and cooperative. Well-nourished and well-developed.  Head:  Normocephalic and atraumatic. Eyes:  Without icterus Abdomen:  +BS, soft, non-tender and non-distended. No HSM noted. No guarding or rebound. No masses appreciated.  Rectal:  Deferred  Msk:  Symmetrical without gross deformities. Normal posture. Extremities:  Without  edema. Neurologic:  Alert and  oriented x4;  grossly normal neurologically. Skin:  Intact without significant lesions or rashes. Psych:  Alert and cooperative. Normal mood and affect.   Assessment   PATCHES MCDONNELL is a 73 y.o. female presenting today with a history of GERD, chronic diarrhea, now with worsening diarrhea from baseline , last seen July  2025. Has had suspicion for bile salt diarrhea/IBS historically.   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 She has clinically improved with Zenpep  and colestipol , which is now novel dosing of every 3rd day as any more frequent causes constipation.   As fecal cal was borderline elevated during this time, we will recheck now. If remains elevated, needs capsule study. Colonoscopy is on file as of Nov 2024.   I always would like for her to discuss with her PCP a drug holiday off metformin ; although she has been on this chronically, I have seen patients with worsening diarrhea on metformin  even though no symptoms prior, with good response after drug holiday. She will be seeing her PCP in next few weeks and discuss then.    PLAN    Continue Zenpep  60k TID Continue novel dosing of colestipol  every 3rd day Discuss drug holiday off metformin  with PCP Repeat fecal cal: if still elevated, recommend capsule study 3 month follow-up   Therisa MICAEL Stager, PhD, ANP-BC [facility]   I have reviewed the note and agree with the APP's assessment as described in this progress note  Toribio Fortune, MD Gastroenterology and Hepatology [facility]

## 2024-03-13 NOTE — Patient Instructions (Signed)
 Please complete the stool test when you are able.  When you see your primary care provider, please ask if you can have a drug holiday off metformin  for about a week or so, as I am curious to see how your symptoms will be off of it.  We will see you in 3 months! If the fecal calprotectin is elevated or you don't have improvement off metformin , we will discuss doing the capsule study.  Have a great birthday!  I enjoyed seeing you again today! I value our relationship and want to provide genuine, compassionate, and quality care. You may receive a survey regarding your visit with me, and I welcome your feedback! Thanks so much for taking the time to complete this. I look forward to seeing you again.      Therisa MICAEL Stager, PhD, ANP-BC [facility]

## 2024-03-31 LAB — CALPROTECTIN, FECAL: Calprotectin, Fecal: 74 ug/g (ref 0–120)

## 2024-04-01 ENCOUNTER — Ambulatory Visit: Payer: Self-pay | Admitting: Gastroenterology

## 2024-04-17 ENCOUNTER — Encounter (INDEPENDENT_AMBULATORY_CARE_PROVIDER_SITE_OTHER): Payer: Self-pay | Admitting: Gastroenterology

## 2024-06-19 ENCOUNTER — Ambulatory Visit: Admitting: Gastroenterology

## 2024-06-19 ENCOUNTER — Encounter: Payer: Self-pay | Admitting: Gastroenterology

## 2024-08-19 ENCOUNTER — Ambulatory Visit: Admitting: Gastroenterology
